# Patient Record
Sex: Female | Born: 1950
Health system: Southern US, Community
[De-identification: ages and names within clinical notes are randomized; demographics above are authoritative.]

## PROBLEM LIST (undated history)

## (undated) DIAGNOSIS — E785 Hyperlipidemia, unspecified: Secondary | ICD-10-CM

## (undated) DIAGNOSIS — J189 Pneumonia, unspecified organism: Secondary | ICD-10-CM

## (undated) DIAGNOSIS — Z923 Personal history of irradiation: Secondary | ICD-10-CM

## (undated) DIAGNOSIS — Z6839 Body mass index (BMI) 39.0-39.9, adult: Secondary | ICD-10-CM

## (undated) DIAGNOSIS — I10 Essential (primary) hypertension: Secondary | ICD-10-CM

## (undated) HISTORY — DX: Hyperlipidemia, unspecified: E78.5

## (undated) HISTORY — DX: Body mass index (BMI) 39.0-39.9, adult: Z68.39

## (undated) HISTORY — PX: EYE SURGERY: SHX253

## (undated) HISTORY — DX: Personal history of irradiation: Z92.3

---

## 2007-05-01 ENCOUNTER — Other Ambulatory Visit: Admission: RE | Admit: 2007-05-01 | Discharge: 2007-05-01 | Payer: Self-pay | Admitting: Gynecology

## 2011-09-17 ENCOUNTER — Emergency Department (HOSPITAL_COMMUNITY)
Admission: EM | Admit: 2011-09-17 | Discharge: 2011-09-17 | Disposition: A | Discharge status: Home / Self Care | Payer: Federal, State, Local not specified - PPO | Attending: Emergency Medicine | Admitting: Emergency Medicine

## 2011-09-17 ENCOUNTER — Encounter (HOSPITAL_COMMUNITY): Payer: Self-pay

## 2011-09-17 DIAGNOSIS — I10 Essential (primary) hypertension: Secondary | ICD-10-CM | POA: Insufficient documentation

## 2011-09-17 DIAGNOSIS — H43399 Other vitreous opacities, unspecified eye: Secondary | ICD-10-CM | POA: Insufficient documentation

## 2011-09-17 DIAGNOSIS — H43391 Other vitreous opacities, right eye: Secondary | ICD-10-CM

## 2011-09-17 DIAGNOSIS — Z79899 Other long term (current) drug therapy: Secondary | ICD-10-CM | POA: Insufficient documentation

## 2011-09-17 HISTORY — DX: Essential (primary) hypertension: I10

## 2011-09-17 NOTE — ED Notes (Addendum)
Patient state she started to see floaters in her vision of her right eye on Friday and denies any pain in eyes bilateral. No redness bilateral or swelling of face around eyes.

## 2011-09-17 NOTE — ED Provider Notes (Signed)
History     CSN: [id number]  Arrival date & time 09/17/11  1420   First MD Initiated Contact with Patient 09/17/11 1505      Chief Complaint  Patient presents with  . Spots and/or Floaters    (Consider location/radiation/quality/duration/timing/severity/associated sxs/prior treatment) HPI Comments: 61yo AAF with PMH significant for hypertension who presents to the ED due to a floater in her right eye onset about 3 days ago. Denies pain or decrease in vision.   Patient is a 61 y.o. female presenting with eye problem. The history is provided by the patient.  Eye Problem  This is a new problem. Episode onset: 3 days. The problem occurs constantly. The problem has not changed since onset.There is pain in the right eye. There was no injury mechanism. The patient is experiencing no pain. There is no history of trauma to the eye. She does not wear contacts. Pertinent negatives include no numbness, no blurred vision, no decreased vision, no discharge, no double vision, no foreign body sensation, no photophobia, no eye redness, no nausea, no vomiting, no tingling, no weakness and no itching. She has tried nothing for the symptoms.    Past Medical History  Diagnosis Date  . Hypertension     History reviewed. No pertinent past surgical history.  No family history on file.  History  Substance Use Topics  . Smoking status: Never Smoker   . Smokeless tobacco: Not on file  . Alcohol Use: Yes    OB History    Grav Para Term Preterm Abortions TAB SAB Ect Mult Living                  Review of Systems  Constitutional: Negative for fever.  HENT: Negative for congestion, rhinorrhea and neck pain.   Eyes: Negative for blurred vision, double vision, photophobia, pain, discharge, redness, itching and visual disturbance.  Respiratory: Negative for shortness of breath.   Cardiovascular: Negative for chest pain.  Gastrointestinal: Negative for nausea, vomiting and abdominal pain.  Skin:  Negative for itching and rash.  Neurological: Negative for dizziness, tingling, syncope, facial asymmetry, speech difficulty, weakness, light-headedness, numbness and headaches.    Allergies  Penicillins  Home Medications   Current Outpatient Rx  Name Route Sig Dispense Refill  . CALCIUM CITRATE + D PO Oral Take 1 tablet by mouth daily.    Marland Kitchen HYDROCHLOROTHIAZIDE 12.5 MG PO TABS Oral Take 12.5 mg by mouth daily.    Marland Kitchen LISINOPRIL 10 MG PO TABS Oral Take 10 mg by mouth daily.    Carma Leaven M PLUS PO TABS Oral Take 1 tablet by mouth daily.    Marland Kitchen PRAVASTATIN SODIUM 40 MG PO TABS Oral Take 40 mg by mouth every morning.      BP 108/73  Temp(Src) 98.2 F (36.8 C) (Oral)  Resp 20  Ht 5' 9.5" (1.765 m)  Wt 250 lb (113.399 kg)  BMI 36.39 kg/m2  SpO2 98%  Physical Exam  Nursing note and vitals reviewed. Constitutional: She is oriented to person, place, and time. She appears well-developed and well-nourished.  HENT:  Head: Normocephalic and atraumatic.  Mouth/Throat: Oropharynx is clear and moist.  Eyes: Conjunctivae, EOM and lids are normal. Pupils are equal, round, and reactive to light. Right eye exhibits no discharge. Left eye exhibits no discharge. No scleral icterus.  Fundoscopic exam:      The right eye shows no AV nicking, no exudate, no hemorrhage and no papilledema.       The left  eye shows no AV nicking, no exudate, no hemorrhage and no papilledema.  Slit lamp exam:      The right eye shows no foreign body, no hyphema and no hypopyon.       The left eye shows no foreign body, no hyphema and no hypopyon.  Neck: Normal range of motion. Neck supple. No tracheal deviation present.       No bruits.   Cardiovascular: Normal rate, regular rhythm, normal heart sounds and intact distal pulses.   No murmur heard. Pulmonary/Chest: Effort normal and breath sounds normal. No stridor. No respiratory distress. She has no wheezes. She has no rales.  Abdominal: Soft. Bowel sounds are normal.  There is no tenderness.  Musculoskeletal: Normal range of motion.  Neurological: She is alert and oriented to person, place, and time. She has normal strength and normal reflexes. No cranial nerve deficit or sensory deficit.    ED Course  Procedures (including critical care time)  Labs Reviewed - No data to display No results found.   1. Flashers or floaters, right eye       MDM  61yo AAF with PMH significant for HTN who presents to the ED due to floater in her right eye. Onset about 3d ago. She is visiting family locally, she lives in [place]. Pt w/o HA, weakness, or numbness. Neuro exam WNL. BP normal. Fundoscopic and slit lamp exam normal. Discussed with opthalmology who does not request further. He will see her in clinic in the morning.         Verne Carrow, MD 09/18/11 [phone]

## 2011-09-17 NOTE — ED Notes (Signed)
EDP at bedside  

## 2011-09-17 NOTE — ED Notes (Signed)
Pt. Developed floaters on Friday,  Denies any pain or discomfort

## 2011-09-18 NOTE — ED Provider Notes (Signed)
I saw and evaluated the patient, reviewed the resident's note and I agree with the findings and plan.  The patient presented complaining of a several day history of having a "floater" in the right eye.  She denies any pain, loss of vision, or trauma.  She is visiting here from [place] and is not due to return there for another month.  On exam, the eye appears grossly normal.  The pupils were equally round and reactive and extraocular muscles were intact.  The corneas were clear.  On fundoscopic exam I was unable to appreciate a retinal detachment or hemorrhage.  There were no visual field cuts.  Opthalmology was consulted and arrangements were made for the patient to be seen by them tomorrow morning.    Geoffery Lyons, MD 09/18/11 [phone]

## 2014-12-21 DIAGNOSIS — Z6835 Body mass index (BMI) 35.0-35.9, adult: Secondary | ICD-10-CM | POA: Diagnosis not present

## 2014-12-21 DIAGNOSIS — E785 Hyperlipidemia, unspecified: Secondary | ICD-10-CM | POA: Diagnosis not present

## 2014-12-21 DIAGNOSIS — I10 Essential (primary) hypertension: Secondary | ICD-10-CM | POA: Diagnosis not present

## 2014-12-21 DIAGNOSIS — E6609 Other obesity due to excess calories: Secondary | ICD-10-CM | POA: Diagnosis not present

## 2015-04-06 DIAGNOSIS — Z1231 Encounter for screening mammogram for malignant neoplasm of breast: Secondary | ICD-10-CM | POA: Diagnosis not present

## 2015-06-23 DIAGNOSIS — E785 Hyperlipidemia, unspecified: Secondary | ICD-10-CM | POA: Diagnosis not present

## 2015-06-23 DIAGNOSIS — Z0000 Encounter for general adult medical examination without abnormal findings: Secondary | ICD-10-CM | POA: Diagnosis not present

## 2015-06-23 DIAGNOSIS — Z6837 Body mass index (BMI) 37.0-37.9, adult: Secondary | ICD-10-CM | POA: Diagnosis not present

## 2015-06-23 DIAGNOSIS — E669 Obesity, unspecified: Secondary | ICD-10-CM | POA: Diagnosis not present

## 2015-06-23 DIAGNOSIS — Z23 Encounter for immunization: Secondary | ICD-10-CM | POA: Diagnosis not present

## 2015-06-23 DIAGNOSIS — I10 Essential (primary) hypertension: Secondary | ICD-10-CM | POA: Diagnosis not present

## 2015-07-26 DIAGNOSIS — Z780 Asymptomatic menopausal state: Secondary | ICD-10-CM | POA: Diagnosis not present

## 2015-07-26 DIAGNOSIS — Z13820 Encounter for screening for osteoporosis: Secondary | ICD-10-CM | POA: Diagnosis not present

## 2015-07-27 DIAGNOSIS — M71572 Other bursitis, not elsewhere classified, left ankle and foot: Secondary | ICD-10-CM | POA: Diagnosis not present

## 2015-07-27 DIAGNOSIS — M216X2 Other acquired deformities of left foot: Secondary | ICD-10-CM | POA: Diagnosis not present

## 2015-07-27 DIAGNOSIS — M7989 Other specified soft tissue disorders: Secondary | ICD-10-CM | POA: Diagnosis not present

## 2015-08-04 DIAGNOSIS — M71572 Other bursitis, not elsewhere classified, left ankle and foot: Secondary | ICD-10-CM | POA: Diagnosis not present

## 2015-08-04 DIAGNOSIS — D2372 Other benign neoplasm of skin of left lower limb, including hip: Secondary | ICD-10-CM | POA: Diagnosis not present

## 2015-08-18 DIAGNOSIS — M71572 Other bursitis, not elsewhere classified, left ankle and foot: Secondary | ICD-10-CM | POA: Diagnosis not present

## 2015-08-18 DIAGNOSIS — S86012A Strain of left Achilles tendon, initial encounter: Secondary | ICD-10-CM | POA: Diagnosis not present

## 2015-12-22 DIAGNOSIS — N858 Other specified noninflammatory disorders of uterus: Secondary | ICD-10-CM | POA: Diagnosis not present

## 2015-12-22 DIAGNOSIS — N950 Postmenopausal bleeding: Secondary | ICD-10-CM | POA: Diagnosis not present

## 2015-12-28 DIAGNOSIS — E669 Obesity, unspecified: Secondary | ICD-10-CM | POA: Diagnosis not present

## 2015-12-28 DIAGNOSIS — I10 Essential (primary) hypertension: Secondary | ICD-10-CM | POA: Diagnosis not present

## 2015-12-28 DIAGNOSIS — E785 Hyperlipidemia, unspecified: Secondary | ICD-10-CM | POA: Diagnosis not present

## 2016-01-31 ENCOUNTER — Emergency Department (HOSPITAL_COMMUNITY)
Admission: EM | Admit: 2016-01-31 | Discharge: 2016-01-31 | Disposition: A | Discharge status: Home / Self Care | Payer: Commercial Managed Care - HMO | Attending: Emergency Medicine | Admitting: Emergency Medicine

## 2016-01-31 ENCOUNTER — Encounter (HOSPITAL_COMMUNITY): Payer: Self-pay | Admitting: Nurse Practitioner

## 2016-01-31 DIAGNOSIS — L509 Urticaria, unspecified: Secondary | ICD-10-CM | POA: Diagnosis not present

## 2016-01-31 DIAGNOSIS — Z79899 Other long term (current) drug therapy: Secondary | ICD-10-CM | POA: Insufficient documentation

## 2016-01-31 DIAGNOSIS — I10 Essential (primary) hypertension: Secondary | ICD-10-CM | POA: Diagnosis not present

## 2016-01-31 DIAGNOSIS — L298 Other pruritus: Secondary | ICD-10-CM | POA: Diagnosis present

## 2016-01-31 MED ORDER — CETIRIZINE HCL 10 MG PO TABS
10.0000 mg | ORAL_TABLET | Freq: Every day | ORAL | Status: DC
Start: 2016-01-31 — End: 2021-08-18

## 2016-01-31 MED ORDER — PREDNISONE 20 MG PO TABS: 40.0000 mg | ORAL_TABLET | Freq: Every day | ORAL | Status: DC

## 2016-01-31 NOTE — ED Provider Notes (Signed)
CSN: [id number]     Arrival date & time 01/31/16  1212 History   By signing my name below, I, Rowan Blase, attest that this documentation has been prepared under the direction and in the presence of non-physician practitioner, Gloriann Loan, PA-C. Electronically Signed: Rowan Blase, Scribe. 01/31/2016. 1:19 PM.    Chief Complaint  Patient presents with  . Skin Problem    The history is provided by the patient. No language interpreter was used.   HPI Comments:  Lindsey Wallace is a 65 y.o. female with PMHx of HTN who presents to the Emergency Department complaining of burning, itchy, swollen areas with 2 on right arm and 1 on left pinky finger she noticed ~0100. Pt thinks she was bitten by something. Pt applied anti-itch lotion with temporary relief. Denies numbness, tingling, fever, chills, shortness of breath, recent changes to soaps or detergents.   Past Medical History  Diagnosis Date  . Hypertension    History reviewed. No pertinent past surgical history. History reviewed. No pertinent family history. Social History  Substance Use Topics  . Smoking status: Never Smoker   . Smokeless tobacco: None  . Alcohol Use: Yes   OB History    No data available     Review of Systems  Constitutional: Negative for fever and chills.  Respiratory: Negative for shortness of breath.   Skin: Positive for wound (bites).  Neurological: Negative for numbness.    Allergies  Penicillins  Home Medications   Prior to Admission medications   Medication Sig Start Date End Date Taking? Authorizing Provider  Calcium Citrate-Vitamin D (CALCIUM CITRATE + D PO) Take 1 tablet by mouth daily.    Historical Provider, MD  hydrochlorothiazide (HYDRODIURIL) 12.5 MG tablet Take 12.5 mg by mouth daily.    Historical Provider, MD  lisinopril (PRINIVIL,ZESTRIL) 10 MG tablet Take 10 mg by mouth daily.    Historical Provider, MD  Multiple Vitamins-Minerals (MULTIVITAMINS THER. W/MINERALS) TABS  Take 1 tablet by mouth daily.    Historical Provider, MD  pravastatin (PRAVACHOL) 40 MG tablet Take 40 mg by mouth every morning.    Historical Provider, MD   BP 110/64 mmHg  Pulse 82  Temp(Src) 98 F (36.7 C) (Oral)  Resp 17  SpO2 100%   Physical Exam  Constitutional: She is oriented to person, place, and time. She appears well-developed and well-nourished.  Non-toxic appearance. She does not have a sickly appearance. She does not appear ill.  HENT:  Head: Normocephalic and atraumatic.  Right Ear: External ear normal.  Left Ear: External ear normal.  Mouth/Throat: Oropharynx is clear and moist.  Eyes: Conjunctivae are normal. No scleral icterus.  Neck: Normal range of motion. Neck supple. No tracheal deviation present.  Cardiovascular: Normal rate, regular rhythm and intact distal pulses.   Pulses:      Radial pulses are 2+ on the right side, and 2+ on the left side.  Pulmonary/Chest: Effort normal and breath sounds normal. No accessory muscle usage or stridor. No respiratory distress. She has no wheezes. She has no rhonchi. She has no rales.  Abdominal: Soft. Bowel sounds are normal. She exhibits no distension. There is no tenderness.  Musculoskeletal: Normal range of motion. She exhibits no tenderness.  Lymphadenopathy:    She has no cervical adenopathy.  Neurological: She is alert and oriented to person, place, and time.  Strength and sensation intact to light touch.   Skin: Skin is warm and dry.  2 hives to RUE.  Compartments soft and compressible.  Psychiatric: She has a normal mood and affect. Her behavior is normal.    ED Course  Procedures  DIAGNOSTIC STUDIES:  Oxygen Saturation is 100% on RA, normal by my interpretation.    COORDINATION OF CARE:  1:17 PM Will start on Zyrtec and short course of Prednisone. Discussed treatment plan with pt at bedside and pt agreed to plan.  Labs Review Labs Reviewed - No data to display  Imaging Review No results found. I  have personally reviewed and evaluated these images and lab results as part of my medical decision-making.   EKG Interpretation None      MDM   Final diagnoses:  Hives   Rx for prednisone and zyrtec. Follow up PCP.  Discussed return precautions.  Patient agrees and acknowledges the above plan for discharge.    Gloriann Loan, PA-C 01/31/16 1323  Sherwood Gambler, MD 02/03/16 [phone]

## 2016-01-31 NOTE — ED Notes (Signed)
Pt c/o waking this morning with painful swollen areas x 2 to R arm and 1 to L pinky finger. Describes pain as "burning." she thinks something may have bitten her in her sleep. Denies new product, medication, food use recently. She is alert and breathing easily

## 2016-01-31 NOTE — Discharge Instructions (Signed)
Hives Hives are itchy, red, swollen areas of the skin. They can vary in size and location on your body. Hives can come and go for hours or several days (acute hives) or for several weeks (chronic hives). Hives do not spread from person to person (noncontagious). They may get worse with scratching, exercise, and emotional stress. CAUSES   Allergic reaction to food, additives, or drugs.  Infections, including the common cold.  Illness, such as vasculitis, lupus, or thyroid disease.  Exposure to sunlight, heat, or cold.  Exercise.  Stress.  Contact with chemicals. SYMPTOMS   Red or white swollen patches on the skin. The patches may change size, shape, and location quickly and repeatedly.  Itching.  Swelling of the hands, feet, and face. This may occur if hives develop deeper in the skin. DIAGNOSIS  Your caregiver can usually tell what is wrong by performing a physical exam. Skin or blood tests may also be done to determine the cause of your hives. In some cases, the cause cannot be determined. TREATMENT  Mild cases usually get better with medicines such as antihistamines. Severe cases may require an emergency epinephrine injection. If the cause of your hives is known, treatment includes avoiding that trigger.  HOME CARE INSTRUCTIONS   Avoid causes that trigger your hives.  Take antihistamines as directed by your caregiver to reduce the severity of your hives. Non-sedating or low-sedating antihistamines are usually recommended. Do not drive while taking an antihistamine.  Take any other medicines prescribed for itching as directed by your caregiver.  Wear loose-fitting clothing.  Keep all follow-up appointments as directed by your caregiver. SEEK MEDICAL CARE IF:   You have persistent or severe itching that is not relieved with medicine.  You have painful or swollen joints. SEEK IMMEDIATE MEDICAL CARE IF:   You have a fever.  Your tongue or lips are swollen.  You have  trouble breathing or swallowing.  You feel tightness in the throat or chest.  You have abdominal pain. These problems may be the first sign of a life-threatening allergic reaction. Call your local emergency services (911 in [place] MAKE SURE YOU:   Understand these instructions.  Will watch your condition.  Will get help right away if you are not doing well or get worse.   This information is not intended to replace advice given to you by your health care provider. Make sure you discuss any questions you have with your health care provider.   Document Released: 08/06/2005 Document Revised: 08/11/2013 Document Reviewed: 10/30/2011 Elsevier Interactive Patient Education 2016 [employer]  

## 2016-04-11 DIAGNOSIS — Z1231 Encounter for screening mammogram for malignant neoplasm of breast: Secondary | ICD-10-CM | POA: Diagnosis not present

## 2016-11-06 DIAGNOSIS — Z0000 Encounter for general adult medical examination without abnormal findings: Secondary | ICD-10-CM | POA: Diagnosis not present

## 2016-11-06 DIAGNOSIS — E785 Hyperlipidemia, unspecified: Secondary | ICD-10-CM | POA: Diagnosis not present

## 2016-11-06 DIAGNOSIS — I10 Essential (primary) hypertension: Secondary | ICD-10-CM | POA: Diagnosis not present

## 2016-11-06 DIAGNOSIS — E669 Obesity, unspecified: Secondary | ICD-10-CM | POA: Diagnosis not present

## 2016-12-18 DIAGNOSIS — Z23 Encounter for immunization: Secondary | ICD-10-CM | POA: Diagnosis not present

## 2017-04-12 DIAGNOSIS — Z1231 Encounter for screening mammogram for malignant neoplasm of breast: Secondary | ICD-10-CM | POA: Diagnosis not present

## 2017-10-11 DIAGNOSIS — H524 Presbyopia: Secondary | ICD-10-CM | POA: Diagnosis not present

## 2017-10-11 DIAGNOSIS — H52202 Unspecified astigmatism, left eye: Secondary | ICD-10-CM | POA: Diagnosis not present

## 2017-10-11 DIAGNOSIS — H2513 Age-related nuclear cataract, bilateral: Secondary | ICD-10-CM | POA: Diagnosis not present

## 2017-10-11 DIAGNOSIS — H5213 Myopia, bilateral: Secondary | ICD-10-CM | POA: Diagnosis not present

## 2017-10-11 DIAGNOSIS — H401131 Primary open-angle glaucoma, bilateral, mild stage: Secondary | ICD-10-CM | POA: Diagnosis not present

## 2017-11-07 DIAGNOSIS — Z0000 Encounter for general adult medical examination without abnormal findings: Secondary | ICD-10-CM | POA: Diagnosis not present

## 2017-11-07 DIAGNOSIS — I10 Essential (primary) hypertension: Secondary | ICD-10-CM | POA: Diagnosis not present

## 2017-11-07 DIAGNOSIS — Z1389 Encounter for screening for other disorder: Secondary | ICD-10-CM | POA: Diagnosis not present

## 2017-11-07 DIAGNOSIS — E785 Hyperlipidemia, unspecified: Secondary | ICD-10-CM | POA: Diagnosis not present

## 2017-11-07 DIAGNOSIS — Z6839 Body mass index (BMI) 39.0-39.9, adult: Secondary | ICD-10-CM | POA: Diagnosis not present

## 2017-11-07 DIAGNOSIS — Z1159 Encounter for screening for other viral diseases: Secondary | ICD-10-CM | POA: Diagnosis not present

## 2017-11-07 DIAGNOSIS — E669 Obesity, unspecified: Secondary | ICD-10-CM | POA: Diagnosis not present

## 2017-11-07 DIAGNOSIS — G4700 Insomnia, unspecified: Secondary | ICD-10-CM | POA: Diagnosis not present

## 2017-12-06 DIAGNOSIS — H401131 Primary open-angle glaucoma, bilateral, mild stage: Secondary | ICD-10-CM | POA: Diagnosis not present

## 2018-03-10 DIAGNOSIS — H401131 Primary open-angle glaucoma, bilateral, mild stage: Secondary | ICD-10-CM | POA: Diagnosis not present

## 2018-04-18 DIAGNOSIS — Z1231 Encounter for screening mammogram for malignant neoplasm of breast: Secondary | ICD-10-CM | POA: Diagnosis not present

## 2018-05-21 DIAGNOSIS — E785 Hyperlipidemia, unspecified: Secondary | ICD-10-CM | POA: Diagnosis not present

## 2018-05-21 DIAGNOSIS — E669 Obesity, unspecified: Secondary | ICD-10-CM | POA: Diagnosis not present

## 2018-05-21 DIAGNOSIS — Z23 Encounter for immunization: Secondary | ICD-10-CM | POA: Diagnosis not present

## 2018-05-21 DIAGNOSIS — I10 Essential (primary) hypertension: Secondary | ICD-10-CM | POA: Diagnosis not present

## 2018-07-04 DIAGNOSIS — H401131 Primary open-angle glaucoma, bilateral, mild stage: Secondary | ICD-10-CM | POA: Diagnosis not present

## 2018-07-23 ENCOUNTER — Other Ambulatory Visit: Payer: Self-pay | Admitting: Physician Assistant

## 2018-12-05 DIAGNOSIS — Z1389 Encounter for screening for other disorder: Secondary | ICD-10-CM | POA: Diagnosis not present

## 2018-12-05 DIAGNOSIS — G4700 Insomnia, unspecified: Secondary | ICD-10-CM | POA: Diagnosis not present

## 2018-12-05 DIAGNOSIS — E785 Hyperlipidemia, unspecified: Secondary | ICD-10-CM | POA: Diagnosis not present

## 2018-12-05 DIAGNOSIS — I10 Essential (primary) hypertension: Secondary | ICD-10-CM | POA: Diagnosis not present

## 2018-12-05 DIAGNOSIS — Z0000 Encounter for general adult medical examination without abnormal findings: Secondary | ICD-10-CM | POA: Diagnosis not present

## 2018-12-05 DIAGNOSIS — E669 Obesity, unspecified: Secondary | ICD-10-CM | POA: Diagnosis not present

## 2019-02-03 DIAGNOSIS — B354 Tinea corporis: Secondary | ICD-10-CM | POA: Diagnosis not present

## 2019-02-03 DIAGNOSIS — L509 Urticaria, unspecified: Secondary | ICD-10-CM | POA: Diagnosis not present

## 2019-04-20 DIAGNOSIS — R29890 Loss of height: Secondary | ICD-10-CM | POA: Diagnosis not present

## 2019-04-20 DIAGNOSIS — Z780 Asymptomatic menopausal state: Secondary | ICD-10-CM | POA: Diagnosis not present

## 2019-04-20 DIAGNOSIS — Z1231 Encounter for screening mammogram for malignant neoplasm of breast: Secondary | ICD-10-CM | POA: Diagnosis not present

## 2019-04-20 DIAGNOSIS — E2839 Other primary ovarian failure: Secondary | ICD-10-CM | POA: Diagnosis not present

## 2019-06-08 DIAGNOSIS — M722 Plantar fascial fibromatosis: Secondary | ICD-10-CM | POA: Diagnosis not present

## 2019-06-08 DIAGNOSIS — I10 Essential (primary) hypertension: Secondary | ICD-10-CM | POA: Diagnosis not present

## 2019-06-08 DIAGNOSIS — E785 Hyperlipidemia, unspecified: Secondary | ICD-10-CM | POA: Diagnosis not present

## 2019-12-09 DIAGNOSIS — R05 Cough: Secondary | ICD-10-CM | POA: Diagnosis not present

## 2019-12-09 DIAGNOSIS — Z1389 Encounter for screening for other disorder: Secondary | ICD-10-CM | POA: Diagnosis not present

## 2019-12-09 DIAGNOSIS — E785 Hyperlipidemia, unspecified: Secondary | ICD-10-CM | POA: Diagnosis not present

## 2019-12-09 DIAGNOSIS — Z0000 Encounter for general adult medical examination without abnormal findings: Secondary | ICD-10-CM | POA: Diagnosis not present

## 2019-12-09 DIAGNOSIS — R0981 Nasal congestion: Secondary | ICD-10-CM | POA: Diagnosis not present

## 2019-12-09 DIAGNOSIS — I10 Essential (primary) hypertension: Secondary | ICD-10-CM | POA: Diagnosis not present

## 2020-02-02 DIAGNOSIS — H401131 Primary open-angle glaucoma, bilateral, mild stage: Secondary | ICD-10-CM | POA: Diagnosis not present

## 2020-04-26 DIAGNOSIS — Z1231 Encounter for screening mammogram for malignant neoplasm of breast: Secondary | ICD-10-CM | POA: Diagnosis not present

## 2020-05-17 DIAGNOSIS — H401131 Primary open-angle glaucoma, bilateral, mild stage: Secondary | ICD-10-CM | POA: Diagnosis not present

## 2020-05-17 DIAGNOSIS — H524 Presbyopia: Secondary | ICD-10-CM | POA: Diagnosis not present

## 2020-05-17 DIAGNOSIS — H2513 Age-related nuclear cataract, bilateral: Secondary | ICD-10-CM | POA: Diagnosis not present

## 2020-05-17 DIAGNOSIS — H5213 Myopia, bilateral: Secondary | ICD-10-CM | POA: Diagnosis not present

## 2020-06-14 DIAGNOSIS — Z7189 Other specified counseling: Secondary | ICD-10-CM | POA: Diagnosis not present

## 2020-06-14 DIAGNOSIS — E669 Obesity, unspecified: Secondary | ICD-10-CM | POA: Diagnosis not present

## 2020-06-14 DIAGNOSIS — E785 Hyperlipidemia, unspecified: Secondary | ICD-10-CM | POA: Diagnosis not present

## 2020-06-14 DIAGNOSIS — I10 Essential (primary) hypertension: Secondary | ICD-10-CM | POA: Diagnosis not present

## 2020-08-08 DIAGNOSIS — H401131 Primary open-angle glaucoma, bilateral, mild stage: Secondary | ICD-10-CM | POA: Diagnosis not present

## 2020-10-06 DIAGNOSIS — Z01812 Encounter for preprocedural laboratory examination: Secondary | ICD-10-CM | POA: Diagnosis not present

## 2020-10-11 DIAGNOSIS — D125 Benign neoplasm of sigmoid colon: Secondary | ICD-10-CM | POA: Diagnosis not present

## 2020-10-11 DIAGNOSIS — K648 Other hemorrhoids: Secondary | ICD-10-CM | POA: Diagnosis not present

## 2020-10-11 DIAGNOSIS — D120 Benign neoplasm of cecum: Secondary | ICD-10-CM | POA: Diagnosis not present

## 2020-10-11 DIAGNOSIS — D123 Benign neoplasm of transverse colon: Secondary | ICD-10-CM | POA: Diagnosis not present

## 2020-10-11 DIAGNOSIS — Z1211 Encounter for screening for malignant neoplasm of colon: Secondary | ICD-10-CM | POA: Diagnosis not present

## 2020-10-11 DIAGNOSIS — D122 Benign neoplasm of ascending colon: Secondary | ICD-10-CM | POA: Diagnosis not present

## 2020-10-11 DIAGNOSIS — K644 Residual hemorrhoidal skin tags: Secondary | ICD-10-CM | POA: Diagnosis not present

## 2020-10-14 DIAGNOSIS — D122 Benign neoplasm of ascending colon: Secondary | ICD-10-CM | POA: Diagnosis not present

## 2020-10-14 DIAGNOSIS — D120 Benign neoplasm of cecum: Secondary | ICD-10-CM | POA: Diagnosis not present

## 2020-10-14 DIAGNOSIS — D123 Benign neoplasm of transverse colon: Secondary | ICD-10-CM | POA: Diagnosis not present

## 2020-10-14 DIAGNOSIS — D125 Benign neoplasm of sigmoid colon: Secondary | ICD-10-CM | POA: Diagnosis not present

## 2020-11-08 DIAGNOSIS — H401131 Primary open-angle glaucoma, bilateral, mild stage: Secondary | ICD-10-CM | POA: Diagnosis not present

## 2020-12-12 DIAGNOSIS — Z1389 Encounter for screening for other disorder: Secondary | ICD-10-CM | POA: Diagnosis not present

## 2020-12-12 DIAGNOSIS — E785 Hyperlipidemia, unspecified: Secondary | ICD-10-CM | POA: Diagnosis not present

## 2020-12-12 DIAGNOSIS — G4700 Insomnia, unspecified: Secondary | ICD-10-CM | POA: Diagnosis not present

## 2020-12-12 DIAGNOSIS — Z0000 Encounter for general adult medical examination without abnormal findings: Secondary | ICD-10-CM | POA: Diagnosis not present

## 2020-12-12 DIAGNOSIS — I10 Essential (primary) hypertension: Secondary | ICD-10-CM | POA: Diagnosis not present

## 2020-12-12 DIAGNOSIS — E669 Obesity, unspecified: Secondary | ICD-10-CM | POA: Diagnosis not present

## 2021-02-16 DIAGNOSIS — H401131 Primary open-angle glaucoma, bilateral, mild stage: Secondary | ICD-10-CM | POA: Diagnosis not present

## 2021-04-13 DIAGNOSIS — N950 Postmenopausal bleeding: Secondary | ICD-10-CM | POA: Diagnosis not present

## 2021-04-13 DIAGNOSIS — E7439 Other disorders of intestinal carbohydrate absorption: Secondary | ICD-10-CM | POA: Diagnosis not present

## 2021-04-13 DIAGNOSIS — R609 Edema, unspecified: Secondary | ICD-10-CM | POA: Diagnosis not present

## 2021-04-13 DIAGNOSIS — M5430 Sciatica, unspecified side: Secondary | ICD-10-CM | POA: Diagnosis not present

## 2021-05-08 DIAGNOSIS — I10 Essential (primary) hypertension: Secondary | ICD-10-CM | POA: Insufficient documentation

## 2021-05-08 DIAGNOSIS — N950 Postmenopausal bleeding: Secondary | ICD-10-CM | POA: Diagnosis not present

## 2021-05-08 DIAGNOSIS — Z01419 Encounter for gynecological examination (general) (routine) without abnormal findings: Secondary | ICD-10-CM | POA: Diagnosis not present

## 2021-05-22 DIAGNOSIS — H401131 Primary open-angle glaucoma, bilateral, mild stage: Secondary | ICD-10-CM | POA: Diagnosis not present

## 2021-05-22 DIAGNOSIS — H5213 Myopia, bilateral: Secondary | ICD-10-CM | POA: Diagnosis not present

## 2021-05-22 DIAGNOSIS — H524 Presbyopia: Secondary | ICD-10-CM | POA: Diagnosis not present

## 2021-05-22 DIAGNOSIS — H2513 Age-related nuclear cataract, bilateral: Secondary | ICD-10-CM | POA: Diagnosis not present

## 2021-05-24 DIAGNOSIS — N8502 Endometrial intraepithelial neoplasia [EIN]: Secondary | ICD-10-CM | POA: Diagnosis not present

## 2021-05-24 DIAGNOSIS — N950 Postmenopausal bleeding: Secondary | ICD-10-CM | POA: Diagnosis not present

## 2021-05-24 DIAGNOSIS — R9389 Abnormal findings on diagnostic imaging of other specified body structures: Secondary | ICD-10-CM | POA: Diagnosis not present

## 2021-05-25 DIAGNOSIS — Z1231 Encounter for screening mammogram for malignant neoplasm of breast: Secondary | ICD-10-CM | POA: Diagnosis not present

## 2021-06-05 DIAGNOSIS — Z780 Asymptomatic menopausal state: Secondary | ICD-10-CM | POA: Diagnosis not present

## 2021-06-16 DIAGNOSIS — Z0100 Encounter for examination of eyes and vision without abnormal findings: Secondary | ICD-10-CM | POA: Diagnosis not present

## 2021-06-16 DIAGNOSIS — N8502 Endometrial intraepithelial neoplasia [EIN]: Secondary | ICD-10-CM | POA: Diagnosis not present

## 2021-07-20 DIAGNOSIS — C801 Malignant (primary) neoplasm, unspecified: Secondary | ICD-10-CM

## 2021-07-20 HISTORY — DX: Malignant (primary) neoplasm, unspecified: C80.1

## 2021-07-26 DIAGNOSIS — R399 Unspecified symptoms and signs involving the genitourinary system: Secondary | ICD-10-CM | POA: Diagnosis not present

## 2021-08-03 DIAGNOSIS — N950 Postmenopausal bleeding: Secondary | ICD-10-CM | POA: Diagnosis not present

## 2021-08-03 DIAGNOSIS — R9389 Abnormal findings on diagnostic imaging of other specified body structures: Secondary | ICD-10-CM | POA: Diagnosis not present

## 2021-08-03 DIAGNOSIS — N840 Polyp of corpus uteri: Secondary | ICD-10-CM | POA: Diagnosis not present

## 2021-08-03 DIAGNOSIS — N8500 Endometrial hyperplasia, unspecified: Secondary | ICD-10-CM | POA: Diagnosis not present

## 2021-08-03 DIAGNOSIS — C541 Malignant neoplasm of endometrium: Secondary | ICD-10-CM | POA: Diagnosis not present

## 2021-08-03 HISTORY — PX: DILATION AND CURETTAGE OF UTERUS: SHX78

## 2021-08-11 ENCOUNTER — Telehealth: Payer: Self-pay | Admitting: *Deleted

## 2021-08-11 NOTE — Telephone Encounter (Signed)
Lat entry----12/22 at 1:15 pm called and spoke with the patient to schedule appt. Patient stated that she would call the office back in 10 minutes. Patient has not returned the call.    12/23 at 1:30 pm called and left the patient a message to call the office back

## 2021-08-15 NOTE — Telephone Encounter (Signed)
Spoke with the patient and moved her appt from 1/6 to 12/30 with Dr Berline Lopes

## 2021-08-17 ENCOUNTER — Encounter: Payer: Self-pay | Admitting: Gynecologic Oncology

## 2021-08-17 DIAGNOSIS — Z09 Encounter for follow-up examination after completed treatment for conditions other than malignant neoplasm: Secondary | ICD-10-CM | POA: Diagnosis not present

## 2021-08-17 DIAGNOSIS — C541 Malignant neoplasm of endometrium: Secondary | ICD-10-CM | POA: Diagnosis not present

## 2021-08-17 DIAGNOSIS — D509 Iron deficiency anemia, unspecified: Secondary | ICD-10-CM | POA: Diagnosis not present

## 2021-08-17 NOTE — Progress Notes (Signed)
GYNECOLOGIC ONCOLOGY NEW PATIENT CONSULTATION   Patient Name: Lindsey Wallace  Patient Age: 70 y.o. Date of Service: 08/18/21 Referring Provider: Eyvonne Mechanic, MD  Primary Care Provider: Default, Provider, MD Consulting Provider: [name], MD   Assessment/Plan:  Postmenopausal patient with clinical stage I high-grade endometrial cancer.  We reviewed the nature of endometrial cancer and its recommended surgical staging, including total hysterectomy, bilateral salpingo-oophorectomy, and lymph node assessment. The patient is a suitable candidate for staging via a minimally invasive approach to surgery.  We reviewed that robotic assistance would be used to complete the surgery.   We discussed that most endometrial cancer is detected early, however, we reviewed that adjuvant therapy will likely be recommended based on the patient's biopsy, however, we will defer to final pathology results.   Given her high risk histology, I recommend CT scan preoperatively to rule out metastatic disease.  We reviewed the sentinel lymph node technique. Risks and benefits of sentinel lymph node biopsy was reviewed. We reviewed the technique and ICG dye. The patient DOES NOT have an iodine allergy or known liver dysfunction. We reviewed the false negative rate (0.4%), and that 3% of patients with metastatic disease will not have it detected by SLN biopsy in endometrial cancer. A low risk of allergic reaction to the dye, <0.2% for ICG, has been reported. We also discussed that in the case of failed mapping, which occurs 40% of the time, a bilateral or unilateral lymphadenectomy will be performed at the surgeons discretion.   Potential benefits of sentinel nodes including a higher detection rate for metastasis due to ultrastaging and potential reduction in operative morbidity. However, there remains uncertainty as to the role for treatment of micrometastatic disease. Further, the benefit of operative  morbidity associated with the SLN technique in endometrial cancer is not yet completely known. In other patient populations (e.g. the cervical cancer population) there has been observed reductions in morbidity with SLN biopsy compared to pelvic lymphadenectomy. Lymphedema, nerve dysfunction and lymphocysts are all potential risks with the SLN technique as with complete lymphadenectomy. Additional risks to the patient include the risk of damage to an internal organ while operating in an altered view (e.g. the black and white image of the robotic fluorescence imaging mode).   We discussed plan for a robotic assisted hysterectomy, bilateral salpingo-oophorectomy, sentinel lymph node evaluation, possible lymph node dissection, possible laparotomy. The risks of surgery were discussed in detail and she understands these to include infection; wound separation; hernia; vaginal cuff separation, injury to adjacent organs such as bowel, bladder, blood vessels, ureters and nerves; bleeding which may require blood transfusion; anesthesia risk; thromboembolic events; possible death; unforeseen complications; possible need for re-exploration; medical complications such as heart attack, stroke, pleural effusion and pneumonia; and, if full lymphadenectomy is performed the risk of lymphedema and lymphocyst. The patient will receive DVT and antibiotic prophylaxis as indicated. She voiced a clear understanding. She had the opportunity to ask questions. Perioperative instructions were reviewed with her. Prescriptions for post-op medications were sent to her pharmacy of choice.  A copy of this note was sent to the patient's referring provider.   65 minutes of total time was spent for this patient encounter, including preparation, face-to-face counseling with the patient and coordination of care, and documentation of the encounter.  [name], MD  Division of Gynecologic Oncology  Department of Obstetrics and Gynecology   [facility]  ___________________________________________  Chief Complaint: Chief Complaint  Patient presents with   Endometrial carcinoma (  [place])    History of Present Illness:  Lindsey Wallace is a 70 y.o. y.o. female who is seen in consultation at the request of Dr. Mardelle Matte for an evaluation of high-grade endometrial cancer.  Patient was initially seen in October for postmenopausal bleeding.  She underwent endometrial biopsy on 05/24/2021 that showed focal simple hyperplasia with atypia, fragments of polypoid inactive endometrium.  No malignancy was identified.  [facility]: Uterus measures 8.9 x 4.8 x 3.9 cm with an endometrial lining of 14 mm.  Normal-appearing adnexa without masses.  No free fluid.  Given hyperplasia, additional sampling was recommended.  On 12/15, the patient underwent hysteroscopy and polypectomy.  Pathology reveals endometrioid endometrial adenocarcinoma, FIGO grade 3.  Endometrial intraepithelial neoplasia seen as well.  She began having postmenopausal bleeding initially in August.  This started with a large spot and then she had no bleeding for a week.  She then would have small amount of spotting randomly and not daily.  Bleeding stopped after her first biopsy in October.  She denies any pain or cramping.  She reports regular bowel and bladder function.  Endorses a good appetite without nausea or emesis.  Family history notable for cancer in both grandmothers, patient thinks uterine cancer.  She has a maternal aunt with a history of leukemia.  She lives in [place] with her husband.  She is retired but works part-time as a Lawyer.  PAST MEDICAL HISTORY:  Past Medical History:  Diagnosis Date   BMI 39.0-39.9,adult    HLD (hyperlipidemia)    Hypertension      PAST SURGICAL HISTORY:  Past Surgical History:  Procedure Laterality Date   DILATION AND CURETTAGE OF UTERUS   08/03/2021   EYE SURGERY      OB/GYN HISTORY:  OB History  Gravida Para Term Preterm AB Living  0 0 0 0 0 0  SAB IAB Ectopic Multiple Live Births  0 0 0 0 0    No LMP recorded. Patient is postmenopausal.  Age at menarche: 43  Age at menopause: 85 Hx of HRT: Denies Hx of STDs: Denies Last pap: 04/2021, negative History of abnormal pap smears: Denies  SCREENING STUDIES:  Last mammogram: 03/2020  Last colonoscopy: 09/2020 Last bone mineral density: 2022  MEDICATIONS: Outpatient Encounter Medications as of 08/18/2021  Medication Sig   hydrochlorothiazide (HYDRODIURIL) 12.5 MG tablet Take 12.5 mg by mouth daily.   latanoprost (XALATAN) 0.005 % ophthalmic solution latanoprost 0.005 % eye drops   pravastatin (PRAVACHOL) 40 MG tablet Take 40 mg by mouth every morning.   Calcium Citrate-Vitamin D (CALCIUM CITRATE + D PO) Take 1 tablet by mouth daily. (Patient not taking: Reported on 08/17/2021)   cetirizine (ZYRTEC) 10 MG tablet Take 1 tablet (10 mg total) by mouth daily. (Patient not taking: Reported on 08/17/2021)   lisinopril (PRINIVIL,ZESTRIL) 10 MG tablet Take 10 mg by mouth daily. (Patient not taking: Reported on 08/17/2021)   Multiple Vitamins-Minerals (MULTIVITAMINS THER. W/MINERALS) TABS Take 1 tablet by mouth daily. (Patient not taking: Reported on 08/17/2021)   predniSONE (DELTASONE) 20 MG tablet Take 2 tablets (40 mg total) by mouth daily. (Patient not taking: Reported on 08/17/2021)   No facility-administered encounter medications on file as of 08/18/2021.    ALLERGIES:  Allergies  Allergen Reactions   Penicillins Rash     FAMILY HISTORY:  Family History  Problem Relation Age of Onset   Uterine cancer Maternal Grandmother    Uterine cancer Paternal  Grandmother    Leukemia Maternal Aunt    Colon cancer Neg Hx    Breast cancer Neg Hx    Ovarian cancer Neg Hx    Endometrial cancer Neg Hx    Pancreatic cancer Neg Hx    Prostate cancer Neg Hx      SOCIAL  HISTORY:  Social Connections: Not on file    REVIEW OF SYSTEMS:  Denies appetite changes, fevers, chills, fatigue, unexplained weight changes. Denies hearing loss, neck lumps or masses, mouth sores, ringing in ears or voice changes. Denies cough or wheezing.  Denies shortness of breath. Denies chest pain or palpitations. Denies leg swelling. Denies abdominal distention, pain, blood in stools, constipation, diarrhea, nausea, vomiting, or early satiety. Denies pain with intercourse, dysuria, frequency, hematuria or incontinence. Denies hot flashes, pelvic pain, vaginal bleeding or vaginal discharge.   Denies joint pain, back pain or muscle pain/cramps. Denies itching, rash, or wounds. Denies dizziness, headaches, numbness or seizures. Denies swollen lymph nodes or glands, denies easy bruising or bleeding. Denies anxiety, depression, confusion, or decreased concentration.  Physical Exam:  Vital Signs for this encounter:  Blood pressure (!) 155/81, pulse 88, temperature (!) 97 F (36.1 C), weight 270 lb (122.5 kg), SpO2 100 %. Body mass index is 39.3 kg/m. General: Alert, oriented, no acute distress.  HEENT: Normocephalic, atraumatic. Sclera anicteric.  Chest: Clear to auscultation bilaterally. No wheezes, rhonchi, or rales. Cardiovascular: Regular rate and rhythm, no murmurs, rubs, or gallops.  Abdomen: Obese. Normoactive bowel sounds. Soft, nondistended, nontender to palpation. No masses or hepatosplenomegaly appreciated. No palpable fluid wave.  Extremities: Grossly normal range of motion. Warm, well perfused. No edema bilaterally.  Skin: No rashes or lesions.  Lymphatics: No cervical, supraclavicular, or inguinal adenopathy.  GU:  Normal external female genitalia. No lesions. No discharge or bleeding.             Bladder/urethra:  No lesions or masses, well supported bladder             Vagina: No lesions or masses.  No bleeding or discharge.             Cervix: Normal appearing,  no lesions.             Uterus: Small, mobile, no parametrial involvement or nodularity.             Adnexa: No masses appreciated.  Rectal: No nodularity.  LABORATORY AND RADIOLOGIC DATA:  Outside medical records were reviewed to synthesize the above history, along with the history and physical obtained during the visit.   No results found for: WBC, HGB, HCT, PLT, GLUCOSE, CHOL, TRIG, HDL, LDLDIRECT, LDLCALC, ALT, AST, NA, K, CL, CREATININE, BUN, CO2, TSH, PSA, INR, GLUF, HGBA1C, MICROALBUR

## 2021-08-17 NOTE — H&P (View-Only) (Signed)
GYNECOLOGIC ONCOLOGY NEW PATIENT CONSULTATION   Patient Name: Lindsey Wallace  Patient Age: 70 y.o. Date of Service: 08/18/21 Referring Provider: Eyvonne Mechanic, MD  Primary Care Provider: Default, Provider, MD Consulting Provider: [name], MD   Assessment/Plan:  Postmenopausal patient with clinical stage I high-grade endometrial cancer.  We reviewed the nature of endometrial cancer and its recommended surgical staging, including total hysterectomy, bilateral salpingo-oophorectomy, and lymph node assessment. The patient is a suitable candidate for staging via a minimally invasive approach to surgery.  We reviewed that robotic assistance would be used to complete the surgery.   We discussed that most endometrial cancer is detected early, however, we reviewed that adjuvant therapy will likely be recommended based on the patient's biopsy, however, we will defer to final pathology results.   Given her high risk histology, I recommend CT scan preoperatively to rule out metastatic disease.  We reviewed the sentinel lymph node technique. Risks and benefits of sentinel lymph node biopsy was reviewed. We reviewed the technique and ICG dye. The patient DOES NOT have an iodine allergy or known liver dysfunction. We reviewed the false negative rate (0.4%), and that 3% of patients with metastatic disease will not have it detected by SLN biopsy in endometrial cancer. A low risk of allergic reaction to the dye, <0.2% for ICG, has been reported. We also discussed that in the case of failed mapping, which occurs 40% of the time, a bilateral or unilateral lymphadenectomy will be performed at the surgeons discretion.   Potential benefits of sentinel nodes including a higher detection rate for metastasis due to ultrastaging and potential reduction in operative morbidity. However, there remains uncertainty as to the role for treatment of micrometastatic disease. Further, the benefit of operative  morbidity associated with the SLN technique in endometrial cancer is not yet completely known. In other patient populations (e.g. the cervical cancer population) there has been observed reductions in morbidity with SLN biopsy compared to pelvic lymphadenectomy. Lymphedema, nerve dysfunction and lymphocysts are all potential risks with the SLN technique as with complete lymphadenectomy. Additional risks to the patient include the risk of damage to an internal organ while operating in an altered view (e.g. the black and white image of the robotic fluorescence imaging mode).   We discussed plan for a robotic assisted hysterectomy, bilateral salpingo-oophorectomy, sentinel lymph node evaluation, possible lymph node dissection, possible laparotomy. The risks of surgery were discussed in detail and she understands these to include infection; wound separation; hernia; vaginal cuff separation, injury to adjacent organs such as bowel, bladder, blood vessels, ureters and nerves; bleeding which may require blood transfusion; anesthesia risk; thromboembolic events; possible death; unforeseen complications; possible need for re-exploration; medical complications such as heart attack, stroke, pleural effusion and pneumonia; and, if full lymphadenectomy is performed the risk of lymphedema and lymphocyst. The patient will receive DVT and antibiotic prophylaxis as indicated. She voiced a clear understanding. She had the opportunity to ask questions. Perioperative instructions were reviewed with her. Prescriptions for post-op medications were sent to her pharmacy of choice.  A copy of this note was sent to the patient's referring provider.   65 minutes of total time was spent for this patient encounter, including preparation, face-to-face counseling with the patient and coordination of care, and documentation of the encounter.  [name], MD  Division of Gynecologic Oncology  Department of Obstetrics and Gynecology   [facility]  ___________________________________________  Chief Complaint: Chief Complaint  Patient presents with   Endometrial carcinoma (  [place])    History of Present Illness:  Lindsey Wallace is a 70 y.o. y.o. female who is seen in consultation at the request of Dr. Mardelle Matte for an evaluation of high-grade endometrial cancer.  Patient was initially seen in October for postmenopausal bleeding.  She underwent endometrial biopsy on 05/24/2021 that showed focal simple hyperplasia with atypia, fragments of polypoid inactive endometrium.  No malignancy was identified.  [facility]: Uterus measures 8.9 x 4.8 x 3.9 cm with an endometrial lining of 14 mm.  Normal-appearing adnexa without masses.  No free fluid.  Given hyperplasia, additional sampling was recommended.  On 12/15, the patient underwent hysteroscopy and polypectomy.  Pathology reveals endometrioid endometrial adenocarcinoma, FIGO grade 3.  Endometrial intraepithelial neoplasia seen as well.  She began having postmenopausal bleeding initially in August.  This started with a large spot and then she had no bleeding for a week.  She then would have small amount of spotting randomly and not daily.  Bleeding stopped after her first biopsy in October.  She denies any pain or cramping.  She reports regular bowel and bladder function.  Endorses a good appetite without nausea or emesis.  Family history notable for cancer in both grandmothers, patient thinks uterine cancer.  She has a maternal aunt with a history of leukemia.  She lives in [place] with her husband.  She is retired but works part-time as a Lawyer.  PAST MEDICAL HISTORY:  Past Medical History:  Diagnosis Date   BMI 39.0-39.9,adult    HLD (hyperlipidemia)    Hypertension      PAST SURGICAL HISTORY:  Past Surgical History:  Procedure Laterality Date   DILATION AND CURETTAGE OF UTERUS   08/03/2021   EYE SURGERY      OB/GYN HISTORY:  OB History  Gravida Para Term Preterm AB Living  0 0 0 0 0 0  SAB IAB Ectopic Multiple Live Births  0 0 0 0 0    No LMP recorded. Patient is postmenopausal.  Age at menarche: 54  Age at menopause: 30 Hx of HRT: Denies Hx of STDs: Denies Last pap: 04/2021, negative History of abnormal pap smears: Denies  SCREENING STUDIES:  Last mammogram: 03/2020  Last colonoscopy: 09/2020 Last bone mineral density: 2022  MEDICATIONS: Outpatient Encounter Medications as of 08/18/2021  Medication Sig   hydrochlorothiazide (HYDRODIURIL) 12.5 MG tablet Take 12.5 mg by mouth daily.   latanoprost (XALATAN) 0.005 % ophthalmic solution latanoprost 0.005 % eye drops   pravastatin (PRAVACHOL) 40 MG tablet Take 40 mg by mouth every morning.   Calcium Citrate-Vitamin D (CALCIUM CITRATE + D PO) Take 1 tablet by mouth daily. (Patient not taking: Reported on 08/17/2021)   cetirizine (ZYRTEC) 10 MG tablet Take 1 tablet (10 mg total) by mouth daily. (Patient not taking: Reported on 08/17/2021)   lisinopril (PRINIVIL,ZESTRIL) 10 MG tablet Take 10 mg by mouth daily. (Patient not taking: Reported on 08/17/2021)   Multiple Vitamins-Minerals (MULTIVITAMINS THER. W/MINERALS) TABS Take 1 tablet by mouth daily. (Patient not taking: Reported on 08/17/2021)   predniSONE (DELTASONE) 20 MG tablet Take 2 tablets (40 mg total) by mouth daily. (Patient not taking: Reported on 08/17/2021)   No facility-administered encounter medications on file as of 08/18/2021.    ALLERGIES:  Allergies  Allergen Reactions   Penicillins Rash     FAMILY HISTORY:  Family History  Problem Relation Age of Onset   Uterine cancer Maternal Grandmother    Uterine cancer Paternal  Grandmother    Leukemia Maternal Aunt    Colon cancer Neg Hx    Breast cancer Neg Hx    Ovarian cancer Neg Hx    Endometrial cancer Neg Hx    Pancreatic cancer Neg Hx    Prostate cancer Neg Hx      SOCIAL  HISTORY:  Social Connections: Not on file    REVIEW OF SYSTEMS:  Denies appetite changes, fevers, chills, fatigue, unexplained weight changes. Denies hearing loss, neck lumps or masses, mouth sores, ringing in ears or voice changes. Denies cough or wheezing.  Denies shortness of breath. Denies chest pain or palpitations. Denies leg swelling. Denies abdominal distention, pain, blood in stools, constipation, diarrhea, nausea, vomiting, or early satiety. Denies pain with intercourse, dysuria, frequency, hematuria or incontinence. Denies hot flashes, pelvic pain, vaginal bleeding or vaginal discharge.   Denies joint pain, back pain or muscle pain/cramps. Denies itching, rash, or wounds. Denies dizziness, headaches, numbness or seizures. Denies swollen lymph nodes or glands, denies easy bruising or bleeding. Denies anxiety, depression, confusion, or decreased concentration.  Physical Exam:  Vital Signs for this encounter:  Blood pressure (!) 155/81, pulse 88, temperature (!) 97 F (36.1 C), weight 270 lb (122.5 kg), SpO2 100 %. Body mass index is 39.3 kg/m. General: Alert, oriented, no acute distress.  HEENT: Normocephalic, atraumatic. Sclera anicteric.  Chest: Clear to auscultation bilaterally. No wheezes, rhonchi, or rales. Cardiovascular: Regular rate and rhythm, no murmurs, rubs, or gallops.  Abdomen: Obese. Normoactive bowel sounds. Soft, nondistended, nontender to palpation. No masses or hepatosplenomegaly appreciated. No palpable fluid wave.  Extremities: Grossly normal range of motion. Warm, well perfused. No edema bilaterally.  Skin: No rashes or lesions.  Lymphatics: No cervical, supraclavicular, or inguinal adenopathy.  GU:  Normal external female genitalia. No lesions. No discharge or bleeding.             Bladder/urethra:  No lesions or masses, well supported bladder             Vagina: No lesions or masses.  No bleeding or discharge.             Cervix: Normal appearing,  no lesions.             Uterus: Small, mobile, no parametrial involvement or nodularity.             Adnexa: No masses appreciated.  Rectal: No nodularity.  LABORATORY AND RADIOLOGIC DATA:  Outside medical records were reviewed to synthesize the above history, along with the history and physical obtained during the visit.   No results found for: WBC, HGB, HCT, PLT, GLUCOSE, CHOL, TRIG, HDL, LDLDIRECT, LDLCALC, ALT, AST, NA, K, CL, CREATININE, BUN, CO2, TSH, PSA, INR, GLUF, HGBA1C, MICROALBUR

## 2021-08-18 ENCOUNTER — Inpatient Hospital Stay: Payer: Medicare HMO

## 2021-08-18 ENCOUNTER — Other Ambulatory Visit: Payer: Self-pay

## 2021-08-18 ENCOUNTER — Encounter: Payer: Self-pay | Admitting: Gynecologic Oncology

## 2021-08-18 ENCOUNTER — Inpatient Hospital Stay: Payer: Medicare HMO | Attending: Gynecologic Oncology | Admitting: Gynecologic Oncology

## 2021-08-18 ENCOUNTER — Inpatient Hospital Stay (HOSPITAL_BASED_OUTPATIENT_CLINIC_OR_DEPARTMENT_OTHER): Payer: Medicare HMO | Admitting: Gynecologic Oncology

## 2021-08-18 ENCOUNTER — Telehealth: Payer: Self-pay

## 2021-08-18 VITALS — BP 155/81 | HR 88 | Temp 97.0°F | Wt 270.0 lb

## 2021-08-18 DIAGNOSIS — Z807 Family history of other malignant neoplasms of lymphoid, hematopoietic and related tissues: Secondary | ICD-10-CM | POA: Diagnosis not present

## 2021-08-18 DIAGNOSIS — I10 Essential (primary) hypertension: Secondary | ICD-10-CM | POA: Diagnosis not present

## 2021-08-18 DIAGNOSIS — Z809 Family history of malignant neoplasm, unspecified: Secondary | ICD-10-CM | POA: Insufficient documentation

## 2021-08-18 DIAGNOSIS — C541 Malignant neoplasm of endometrium: Secondary | ICD-10-CM | POA: Diagnosis not present

## 2021-08-18 DIAGNOSIS — E669 Obesity, unspecified: Secondary | ICD-10-CM

## 2021-08-18 DIAGNOSIS — M5430 Sciatica, unspecified side: Secondary | ICD-10-CM | POA: Insufficient documentation

## 2021-08-18 DIAGNOSIS — Z79899 Other long term (current) drug therapy: Secondary | ICD-10-CM | POA: Insufficient documentation

## 2021-08-18 DIAGNOSIS — G4700 Insomnia, unspecified: Secondary | ICD-10-CM | POA: Insufficient documentation

## 2021-08-18 DIAGNOSIS — Z6839 Body mass index (BMI) 39.0-39.9, adult: Secondary | ICD-10-CM | POA: Insufficient documentation

## 2021-08-18 DIAGNOSIS — E785 Hyperlipidemia, unspecified: Secondary | ICD-10-CM | POA: Diagnosis not present

## 2021-08-18 LAB — BASIC METABOLIC PANEL - CANCER CENTER ONLY
Anion gap: 6 (ref 5–15)
BUN: 19 mg/dL (ref 8–23)
CO2: 29 mmol/L (ref 22–32)
Calcium: 9.4 mg/dL (ref 8.9–10.3)
Chloride: 106 mmol/L (ref 98–111)
Creatinine: 0.87 mg/dL (ref 0.44–1.00)
GFR, Estimated: >60 mL/min (ref >60)
Glucose, Bld: 97 mg/dL (ref 70–99)
Potassium: 4.2 mmol/L (ref 3.5–5.1)
Sodium: 141 mmol/L (ref 135–145)

## 2021-08-18 MED ORDER — TRAMADOL HCL 50 MG PO TABS: 50.0000 mg | ORAL_TABLET | Freq: Four times a day (QID) | ORAL | 0 refills | Status: DC | PRN

## 2021-08-18 MED ORDER — SENNOSIDES-DOCUSATE SODIUM 8.6-50 MG PO TABS: 2.0000 | ORAL_TABLET | Freq: Every day | ORAL | 0 refills | Status: DC

## 2021-08-18 NOTE — Progress Notes (Signed)
Patient here for new patient consultation with Dr. [name] and for a pre-operative discussion prior to her scheduled surgery on August 29, 2021. She is scheduled for robotic assisted total laparoscopic hysterectomy, bilateral salpingo-oophorectomy, sentinel lymph node biopsy, possible lymph node dissection, possible laparotomy.  The surgery was discussed in detail.  See after visit summary for additional details. Visual aids used to discuss items related to surgery including sequential compression stockings, foley catheter, IV pump, multi-modal pain regimen including tylenol, photo of the surgical robot, female reproductive system to discuss surgery in detail.      Discussed post-op pain management in detail including the aspects of the enhanced recovery pathway.  Advised her that a new prescription would be sent in for tramadol and it is only to be used for after her upcoming surgery.  We discussed the use of tylenol post-op and to monitor for a maximum of 4,000 mg in a 24 hour period.  Also prescribed sennakot to be used after surgery and to hold if having loose stools.  Discussed bowel regimen in detail.     Discussed the use of heparin pre-op, SCDs, and measures to take at home to prevent DVT including frequent mobility.  Reportable signs and symptoms of DVT discussed. Post-operative instructions discussed and expectations for after surgery. Incisional care discussed as well including reportable signs and symptoms including erythema, drainage, wound separation.     10 minutes spent with the patient.  Verbalizing understanding of material discussed. No needs or concerns voiced at the end of the visit.   Advised patient to call for any needs.  Advised that her post-operative medications had been prescribed and could be picked up at any time.  CT scan information discussed and contrast given. She is advised she will be contacted with results of Bmet from today.    This appointment is included in  the global surgical bundle as pre-operative teaching and has no charge.

## 2021-08-18 NOTE — Patient Instructions (Addendum)
Plan to have a CT scan before surgery and Dr. Berline Lopes will contact you with the results.   Preparing for your Surgery  Plan for surgery on August 29, 2021 with Dr. [name] at [facility] will be scheduled for robotic assisted total laparoscopic hysterectomy (removal of the uterus and cervix), bilateral salpingo-oophorectomy (removal of both ovaries and fallopian tubes), sentinel lymph node biopsy, possible lymph node dissection, possible laparotomy (larger incision on your abdomen if needed).   Pre-operative Testing -You will receive a phone call from presurgical testing at [facility] to arrange for a pre-operative appointment and lab work.  -Bring your insurance card, copy of an advanced directive if applicable, medication list  -At that visit, you will be asked to sign a consent for a possible blood transfusion in case a transfusion becomes necessary during surgery.  The need for a blood transfusion is rare but having consent is a necessary part of your care.     -You should not be taking blood thinners or aspirin at least ten days prior to surgery unless instructed by your surgeon.  -Do not take supplements such as fish oil (omega 3), red yeast rice, turmeric before your surgery. You want to avoid medications with aspirin in them including headache powders such as BC or Goody's), Excedrin migraine.  Day Before Surgery at [facility] will be asked to take in a light diet the day before surgery. You will be advised you can have clear liquids up until 3 hours before your surgery.    Eat a light diet the day before surgery.  Examples including soups, broths, toast, yogurt, mashed potatoes.  AVOID GAS PRODUCING FOODS. Things to avoid include carbonated beverages (fizzy beverages, sodas), raw fruits and raw vegetables (uncooked), or beans.   If your bowels are filled with gas, your surgeon will have difficulty visualizing your pelvic organs which increases your  surgical risks.  Your role in recovery Your role is to become active as soon as directed by your doctor, while still giving yourself time to heal.  Rest when you feel tired. You will be asked to do the following in order to speed your recovery:  - Cough and breathe deeply. This helps to clear and expand your lungs and can prevent pneumonia after surgery.  - [place]. Do mild physical activity. Walking or moving your legs help your circulation and body functions return to normal. Do not try to get up or walk alone the first time after surgery.   -If you develop swelling on one leg or the other, pain in the back of your leg, redness/warmth in one of your legs, please call the office or go to the Emergency Room to have a doppler to rule out a blood clot. For shortness of breath, chest pain-seek care in the Emergency Room as soon as possible. - Actively manage your pain. Managing your pain lets you move in comfort. We will ask you to rate your pain on a scale of zero to 10. It is your responsibility to tell your doctor or nurse where and how much you hurt so your pain can be treated.  Special Considerations -If you are diabetic, you may be placed on insulin after surgery to have closer control over your blood sugars to promote healing and recovery.  This does not mean that you will be discharged on insulin.  If applicable, your oral antidiabetics will be resumed when you are tolerating a solid diet.  -  Your final pathology results from surgery should be available around one week after surgery and the results will be relayed to you when available.  -FMLA forms can be faxed to [phone] and please allow 5-7 business days for completion.  Pain Management After Surgery -You have been prescribed your pain medication and bowel regimen medications before surgery so that you can have these available when you are discharged from the hospital. The pain medication is for use ONLY AFTER  surgery and a new prescription will not be given.   -Make sure that you have Tylenol and Ibuprofen at home to use on a regular basis after surgery for pain control. We recommend alternating the medications every hour to six hours since they work differently and are processed in the body differently for pain relief.  -Review the attached handout on narcotic use and their risks and side effects.   Bowel Regimen -You have been prescribed Sennakot-S to take nightly to prevent constipation especially if you are taking the narcotic pain medication intermittently.  It is important to prevent constipation and drink adequate amounts of liquids. You can stop taking this medication when you are not taking pain medication and you are back on your normal bowel routine.  Risks of Surgery Risks of surgery are low but include bleeding, infection, damage to surrounding structures, re-operation, blood clots, and very rarely death.   Blood Transfusion Information (For the consent to be signed before surgery)  We will be checking your blood type before surgery so in case of emergencies, we will know what type of blood you would need.                                            WHAT IS A BLOOD TRANSFUSION?  A transfusion is the replacement of blood or some of its parts. Blood is made up of multiple cells which provide different functions. Red blood cells carry oxygen and are used for blood loss replacement. White blood cells fight against infection. Platelets control bleeding. Plasma helps clot blood. Other blood products are available for specialized needs, such as hemophilia or other clotting disorders. BEFORE THE TRANSFUSION  Who gives blood for transfusions?  You may be able to donate blood to be used at a later date on yourself (autologous donation). Relatives can be asked to donate blood. This is generally not any safer than if you have received blood from a stranger. The same precautions are taken to  ensure safety when a relative's blood is donated. Healthy volunteers who are fully evaluated to make sure their blood is safe. This is blood bank blood. Transfusion therapy is the safest it has ever been in the practice of medicine. Before blood is taken from a donor, a complete history is taken to make sure that person has no history of diseases nor engages in risky social behavior (examples are intravenous drug use or sexual activity with multiple partners). The donor's travel history is screened to minimize risk of transmitting infections, such as malaria. The donated blood is tested for signs of infectious diseases, such as HIV and hepatitis. The blood is then tested to be sure it is compatible with you in order to minimize the chance of a transfusion reaction. If you or a relative donates blood, this is often done in anticipation of surgery and is not appropriate for emergency situations. It takes many days  to process the donated blood. RISKS AND COMPLICATIONS Although transfusion therapy is very safe and saves many lives, the main dangers of transfusion include:  Getting an infectious disease. Developing a transfusion reaction. This is an allergic reaction to something in the blood you were given. Every precaution is taken to prevent this. The decision to have a blood transfusion has been considered carefully by your caregiver before blood is given. Blood is not given unless the benefits outweigh the risks.  AFTER SURGERY INSTRUCTIONS  Return to work: 4-6 weeks if applicable  Activity: 1. Be up and out of the bed during the day.  Take a nap if needed.  You may walk up steps but be careful and use the hand rail.  Stair climbing will tire you more than you think, you may need to stop part way and rest.   2. No lifting or straining for 6 weeks over 10 pounds. No pushing, pulling, straining for 6 weeks.  3. No driving for 1 week(s).  Do not drive if you are taking narcotic pain medicine and make  sure that your reaction time has returned.   4. You can shower as soon as the next day after surgery. Shower daily.  Use your regular soap and water (not directly on the incision) and pat your incision(s) dry afterwards; don't rub.  No tub baths or submerging your body in water until cleared by your surgeon. If you have the soap that was given to you by pre-surgical testing that was used before surgery, you do not need to use it afterwards because this can irritate your incisions.   5. No sexual activity and nothing in the vagina for 8 weeks.  6. You may experience a small amount of clear drainage from your incisions, which is normal.  If the drainage persists, increases, or changes color please call the office.  7. Do not use creams, lotions, or ointments such as neosporin on your incisions after surgery until advised by your surgeon because they can cause removal of the dermabond glue on your incisions.    8. You may experience vaginal spotting after surgery or around the 6-8 week mark from surgery when the stitches at the top of the vagina begin to dissolve.  The spotting is normal but if you experience heavy bleeding, call our office.  9. Take Tylenol or ibuprofen (you have at home if your kidney function is normal) first for pain and only use narcotic pain medication for severe pain not relieved by the Tylenol or Ibuprofen.  Monitor your Tylenol intake to a max of 4,000 mg in a 24 hour period. You can alternate these medications after surgery.  Diet: 1. Low sodium Heart Healthy Diet is recommended but you are cleared to resume your normal (before surgery) diet after your procedure.  2. It is safe to use a laxative, such as Miralax or Colace, if you have difficulty moving your bowels. You have been prescribed Sennakot-S to take at bedtime every evening after surgery to keep bowel movements regular and to prevent constipation.    Wound Care: 1. Keep clean and dry.  Shower daily.  Reasons to  call the Doctor: Fever - Oral temperature greater than 100.4 degrees Fahrenheit Foul-smelling vaginal discharge Difficulty urinating Nausea and vomiting Increased pain at the site of the incision that is unrelieved with pain medicine. Difficulty breathing with or without chest pain New calf pain especially if only on one side Sudden, continuing increased vaginal bleeding with or without clots.  Contacts: For questions or concerns you should contact:  Dr. [name] at [phone]  Joylene John, NP at [phone]  After Hours: call [phone] and have the GYN Oncologist paged/contacted (after 5 pm or on the weekends).  Messages sent via mychart are for non-urgent matters and are not responded to after hours so for urgent needs, please call the after hours number.

## 2021-08-18 NOTE — Telephone Encounter (Signed)
Attempted to reach Lindsey Wallace this afternoon to review her metabolic panel results from today (08/18/21). No answer. Left VM.

## 2021-08-18 NOTE — Patient Instructions (Signed)
Plan to have a CT scan before surgery and Dr. Berline Lopes will contact you with the results.    Preparing for your Surgery   Plan for surgery on August 29, 2021 with Dr. [name] at [facility] will be scheduled for robotic assisted total laparoscopic hysterectomy (removal of the uterus and cervix), bilateral salpingo-oophorectomy (removal of both ovaries and fallopian tubes), sentinel lymph node biopsy, possible lymph node dissection, possible laparotomy (larger incision on your abdomen if needed).    Pre-operative Testing -You will receive a phone call from presurgical testing at [facility] to arrange for a pre-operative appointment and lab work.   -Bring your insurance card, copy of an advanced directive if applicable, medication list   -At that visit, you will be asked to sign a consent for a possible blood transfusion in case a transfusion becomes necessary during surgery.  The need for a blood transfusion is rare but having consent is a necessary part of your care.      -You should not be taking blood thinners or aspirin at least ten days prior to surgery unless instructed by your surgeon.   -Do not take supplements such as fish oil (omega 3), red yeast rice, turmeric before your surgery. You want to avoid medications with aspirin in them including headache powders such as BC or Goody's), Excedrin migraine.   Day Before Surgery at [facility] will be asked to take in a light diet the day before surgery. You will be advised you can have clear liquids up until 3 hours before your surgery.     Eat a light diet the day before surgery.  Examples including soups, broths, toast, yogurt, mashed potatoes.  AVOID GAS PRODUCING FOODS. Things to avoid include carbonated beverages (fizzy beverages, sodas), raw fruits and raw vegetables (uncooked), or beans.    If your bowels are filled with gas, your surgeon will have difficulty visualizing your pelvic organs which increases  your surgical risks.   Your role in recovery Your role is to become active as soon as directed by your doctor, while still giving yourself time to heal.  Rest when you feel tired. You will be asked to do the following in order to speed your recovery:   - Cough and breathe deeply. This helps to clear and expand your lungs and can prevent pneumonia after surgery.  - [place]. Do mild physical activity. Walking or moving your legs help your circulation and body functions return to normal. Do not try to get up or walk alone the first time after surgery.   -If you develop swelling on one leg or the other, pain in the back of your leg, redness/warmth in one of your legs, please call the office or go to the Emergency Room to have a doppler to rule out a blood clot. For shortness of breath, chest pain-seek care in the Emergency Room as soon as possible. - Actively manage your pain. Managing your pain lets you move in comfort. We will ask you to rate your pain on a scale of zero to 10. It is your responsibility to tell your doctor or nurse where and how much you hurt so your pain can be treated.   Special Considerations -If you are diabetic, you may be placed on insulin after surgery to have closer control over your blood sugars to promote healing and recovery.  This does not mean that you will be discharged on insulin.  If applicable,  your oral antidiabetics will be resumed when you are tolerating a solid diet.   -Your final pathology results from surgery should be available around one week after surgery and the results will be relayed to you when available.   -FMLA forms can be faxed to [phone] and please allow 5-7 business days for completion.   Pain Management After Surgery -You have been prescribed your pain medication and bowel regimen medications before surgery so that you can have these available when you are discharged from the hospital. The pain medication is for use ONLY  AFTER surgery and a new prescription will not be given.    -Make sure that you have Tylenol and Ibuprofen at home to use on a regular basis after surgery for pain control. We recommend alternating the medications every hour to six hours since they work differently and are processed in the body differently for pain relief.   -Review the attached handout on narcotic use and their risks and side effects.    Bowel Regimen -You have been prescribed Sennakot-S to take nightly to prevent constipation especially if you are taking the narcotic pain medication intermittently.  It is important to prevent constipation and drink adequate amounts of liquids. You can stop taking this medication when you are not taking pain medication and you are back on your normal bowel routine.   Risks of Surgery Risks of surgery are low but include bleeding, infection, damage to surrounding structures, re-operation, blood clots, and very rarely death.     Blood Transfusion Information (For the consent to be signed before surgery)   We will be checking your blood type before surgery so in case of emergencies, we will know what type of blood you would need.                                             WHAT IS A BLOOD TRANSFUSION?   A transfusion is the replacement of blood or some of its parts. Blood is made up of multiple cells which provide different functions. Red blood cells carry oxygen and are used for blood loss replacement. White blood cells fight against infection. Platelets control bleeding. Plasma helps clot blood. Other blood products are available for specialized needs, such as hemophilia or other clotting disorders. BEFORE THE TRANSFUSION  Who gives blood for transfusions?  You may be able to donate blood to be used at a later date on yourself (autologous donation). Relatives can be asked to donate blood. This is generally not any safer than if you have received blood from a stranger. The same precautions  are taken to ensure safety when a relative's blood is donated. Healthy volunteers who are fully evaluated to make sure their blood is safe. This is blood bank blood. Transfusion therapy is the safest it has ever been in the practice of medicine. Before blood is taken from a donor, a complete history is taken to make sure that person has no history of diseases nor engages in risky social behavior (examples are intravenous drug use or sexual activity with multiple partners). The donor's travel history is screened to minimize risk of transmitting infections, such as malaria. The donated blood is tested for signs of infectious diseases, such as HIV and hepatitis. The blood is then tested to be sure it is compatible with you in order to minimize the chance of a transfusion reaction.  If you or a relative donates blood, this is often done in anticipation of surgery and is not appropriate for emergency situations. It takes many days to process the donated blood. RISKS AND COMPLICATIONS Although transfusion therapy is very safe and saves many lives, the main dangers of transfusion include:  Getting an infectious disease. Developing a transfusion reaction. This is an allergic reaction to something in the blood you were given. Every precaution is taken to prevent this. The decision to have a blood transfusion has been considered carefully by your caregiver before blood is given. Blood is not given unless the benefits outweigh the risks.   AFTER SURGERY INSTRUCTIONS   Return to work: 4-6 weeks if applicable   Activity: 1. Be up and out of the bed during the day.  Take a nap if needed.  You may walk up steps but be careful and use the hand rail.  Stair climbing will tire you more than you think, you may need to stop part way and rest.    2. No lifting or straining for 6 weeks over 10 pounds. No pushing, pulling, straining for 6 weeks.   3. No driving for 1 week(s).  Do not drive if you are taking narcotic pain  medicine and make sure that your reaction time has returned.    4. You can shower as soon as the next day after surgery. Shower daily.  Use your regular soap and water (not directly on the incision) and pat your incision(s) dry afterwards; don't rub.  No tub baths or submerging your body in water until cleared by your surgeon. If you have the soap that was given to you by pre-surgical testing that was used before surgery, you do not need to use it afterwards because this can irritate your incisions.    5. No sexual activity and nothing in the vagina for 8 weeks.   6. You may experience a small amount of clear drainage from your incisions, which is normal.  If the drainage persists, increases, or changes color please call the office.   7. Do not use creams, lotions, or ointments such as neosporin on your incisions after surgery until advised by your surgeon because they can cause removal of the dermabond glue on your incisions.     8. You may experience vaginal spotting after surgery or around the 6-8 week mark from surgery when the stitches at the top of the vagina begin to dissolve.  The spotting is normal but if you experience heavy bleeding, call our office.   9. Take Tylenol or ibuprofen (you have at home if your kidney function is normal) first for pain and only use narcotic pain medication for severe pain not relieved by the Tylenol or Ibuprofen.  Monitor your Tylenol intake to a max of 4,000 mg in a 24 hour period. You can alternate these medications after surgery.   Diet: 1. Low sodium Heart Healthy Diet is recommended but you are cleared to resume your normal (before surgery) diet after your procedure.   2. It is safe to use a laxative, such as Miralax or Colace, if you have difficulty moving your bowels. You have been prescribed Sennakot-S to take at bedtime every evening after surgery to keep bowel movements regular and to prevent constipation.     Wound Care: 1. Keep clean and dry.   Shower daily.   Reasons to call the Doctor: Fever - Oral temperature greater than 100.4 degrees Fahrenheit Foul-smelling vaginal discharge Difficulty urinating Nausea  and vomiting Increased pain at the site of the incision that is unrelieved with pain medicine. Difficulty breathing with or without chest pain New calf pain especially if only on one side Sudden, continuing increased vaginal bleeding with or without clots.   Contacts: For questions or concerns you should contact:   Dr. [name] at [phone]   Joylene John, NP at [phone]   After Hours: call [phone] and have the GYN Oncologist paged/contacted (after 5 pm or on the weekends).   Messages sent via mychart are for non-urgent matters and are not responded to after hours so for urgent needs, please call the after hours number.

## 2021-08-21 ENCOUNTER — Ambulatory Visit (HOSPITAL_COMMUNITY)
Admission: RE | Admit: 2021-08-21 | Discharge: 2021-08-21 | Disposition: A | Discharge status: Home / Self Care | Payer: Medicare HMO | Source: Ambulatory Visit | Attending: Gynecologic Oncology | Admitting: Gynecologic Oncology

## 2021-08-21 ENCOUNTER — Encounter (HOSPITAL_COMMUNITY): Payer: Self-pay

## 2021-08-21 DIAGNOSIS — N200 Calculus of kidney: Secondary | ICD-10-CM | POA: Diagnosis not present

## 2021-08-21 DIAGNOSIS — C541 Malignant neoplasm of endometrium: Secondary | ICD-10-CM | POA: Diagnosis not present

## 2021-08-21 DIAGNOSIS — I700 Atherosclerosis of aorta: Secondary | ICD-10-CM | POA: Diagnosis not present

## 2021-08-21 DIAGNOSIS — R918 Other nonspecific abnormal finding of lung field: Secondary | ICD-10-CM | POA: Diagnosis not present

## 2021-08-21 DIAGNOSIS — K8020 Calculus of gallbladder without cholecystitis without obstruction: Secondary | ICD-10-CM | POA: Diagnosis not present

## 2021-08-21 DIAGNOSIS — I2510 Atherosclerotic heart disease of native coronary artery without angina pectoris: Secondary | ICD-10-CM | POA: Diagnosis not present

## 2021-08-21 MED ORDER — SODIUM CHLORIDE (PF) 0.9 % IJ SOLN
INTRAMUSCULAR | Status: AC
  Filled 2021-08-21: qty 50

## 2021-08-21 MED ORDER — IOHEXOL 350 MG/ML SOLN
80.0000 mL | Freq: Once | INTRAVENOUS | Status: AC | PRN
  Administered 2021-08-21: 80 mL via INTRAVENOUS

## 2021-08-22 ENCOUNTER — Telehealth: Payer: Self-pay | Admitting: Gynecologic Oncology

## 2021-08-22 ENCOUNTER — Telehealth: Payer: Self-pay

## 2021-08-22 DIAGNOSIS — H401131 Primary open-angle glaucoma, bilateral, mild stage: Secondary | ICD-10-CM | POA: Diagnosis not present

## 2021-08-22 NOTE — Telephone Encounter (Signed)
Called the patient and discussed CT results.  All questions answered.  [name] MD Gynecologic Oncology

## 2021-08-22 NOTE — Telephone Encounter (Signed)
Spoke with Lindsey Wallace this afternoon regarding her lab results from 08/18/2021. Pt informed that her basic metabolic panel is normal. Patient verbalized understanding.

## 2021-08-23 NOTE — Patient Instructions (Addendum)
DUE TO COVID-19 ONLY ONE VISITOR IS ALLOWED TO COME WITH YOU AND STAY IN THE WAITING ROOM ONLY DURING PRE OP AND PROCEDURE.   **NO VISITORS ARE ALLOWED IN THE SHORT STAY AREA OR RECOVERY ROOM!!**       Your procedure is scheduled on: Tuesday, Jan. 10, 2023   Report to [facility] Main Entrance    Report to admitting at 12:30 PM   Call this number if you have problems the morning of surgery [phone]   Do not eat food :After Midnight.   May have liquids until 11:45 AM   day of surgery  CLEAR LIQUID DIET  Foods Allowed                                                                     Foods Excluded  Water, Black Coffee and tea, regular and decaf                             liquids that you cannot  Plain Jell-O in any flavor  (No red)                                           see through such as: Fruit ices (not with fruit pulp)                                     milk, soups, orange juice              Iced Popsicles (No red)                                    All solid food                                   Apple juices Sports drinks like Gatorade (No red) Lightly seasoned clear broth or consume(fat free) Sugar  Sample Menu Breakfast                                Lunch                                     Supper Cranberry juice                    Beef broth                            Chicken broth Jell-O                                     Grape juice  Apple juice Coffee or tea                        Jell-O                                      Popsicle                                                Coffee or tea                        Coffee or tea        The day of surgery:  Drink ONE (1) Pre-Surgery Clear Ensure at 11:45 AM the morning of surgery. Drink in one sitting. Do not sip.  This drink was given to you during your hospital  pre-op appointment visit. Nothing else to drink after completing the  Pre-Surgery Clear Ensure.           If you have questions, please contact your surgeons office.     Oral Hygiene is also important to reduce your risk of infection.                                    Remember - BRUSH YOUR TEETH THE MORNING OF SURGERY WITH YOUR REGULAR TOOTHPASTE   Do NOT smoke after Midnight   Take these medicines the morning of surgery with A SIP OF WATER: Pravastatin             You may not have any metal on your body including hair pins, jewelry, and body piercing             Do not wear make-up, lotions, powders, perfumes/cologne, or deodorant  Do not wear nail polish including gel and S&S, artificial/acrylic nails, or any other type of covering on natural nails including finger and toenails. If you have artificial nails, gel coating, etc. that needs to be removed by a nail salon please have this removed prior to surgery or surgery may need to be canceled/ delayed if the surgeon/ anesthesia feels like they are unable to be safely monitored.   Do not shave  48 hours prior to surgery.    Do not bring valuables to the hospital. [place].   Contacts, dentures or bridgework may not be worn into surgery.    Patients discharged on the day of surgery will not be allowed to drive home.  We recommend you have a responsible adult to stay with you for the first 24 hours after anesthesia.   Special Instructions: Bring a copy of your healthcare power of attorney and living will documents         the day of surgery if you haven't scanned them before.              Please read over the following fact sheets you were given: IF YOU HAVE QUESTIONS ABOUT YOUR PRE-OP INSTRUCTIONS PLEASE CALL [phone]     [facility] - Preparing for Surgery Before surgery, you can play an important role.  Because skin is not sterile, your skin needs to be as free of germs as possible.  You can reduce the number of germs on your skin by washing with CHG (chlorahexidine gluconate) soap  before surgery.  CHG is an antiseptic cleaner which kills germs and bonds with the skin to continue killing germs even after washing. Please DO NOT use if you have an allergy to CHG or antibacterial soaps.  If your skin becomes reddened/irritated stop using the CHG and inform your nurse when you arrive at Short Stay. Do not shave (including legs and underarms) for at least 48 hours prior to the first CHG shower.  You may shave your face/neck.  Please follow these instructions carefully:  1.  Shower with CHG Soap the night before surgery and the  morning of surgery.  2.  If you choose to wash your hair, wash your hair first as usual with your normal  shampoo.  3.  After you shampoo, rinse your hair and body thoroughly to remove the shampoo.                             4.  Use CHG as you would any other liquid soap.  You can apply chg directly to the skin and wash.  Gently with a scrungie or clean washcloth.  5.  Apply the CHG Soap to your body ONLY FROM THE NECK DOWN.   Do   not use on face/ open                           Wound or open sores. Avoid contact with eyes, ears mouth and   genitals (private parts).                       Wash face,  Genitals (private parts) with your normal soap.             6.  Wash thoroughly, paying special attention to the area where your    surgery  will be performed.  7.  Thoroughly rinse your body with warm water from the neck down.  8.  DO NOT shower/wash with your normal soap after using and rinsing off the CHG Soap.                9.  Pat yourself dry with a clean towel.            10.  Wear clean pajamas.            11.  Place clean sheets on your bed the night of your first shower and do not  sleep with pets. Day of Surgery : Do not apply any lotions/deodorants the morning of surgery.  Please wear clean clothes to the hospital/surgery center.  FAILURE TO FOLLOW THESE INSTRUCTIONS MAY RESULT IN THE CANCELLATION OF YOUR SURGERY  PATIENT  SIGNATURE_________________________________  NURSE SIGNATURE__________________________________  ________________________________________________________________________  WHAT IS A BLOOD TRANSFUSION? Blood Transfusion Information  A transfusion is the replacement of blood or some of its parts. Blood is made up of multiple cells which provide different functions. Red blood cells carry oxygen and are used for blood loss replacement. White blood cells fight against infection. Platelets control bleeding. Plasma helps clot blood. Other blood products are available for specialized needs, such as hemophilia or other clotting disorders. BEFORE THE TRANSFUSION  Who gives blood for transfusions?  Healthy volunteers who  are fully evaluated to make sure their blood is safe. This is blood bank blood. Transfusion therapy is the safest it has ever been in the practice of medicine. Before blood is taken from a donor, a complete history is taken to make sure that person has no history of diseases nor engages in risky social behavior (examples are intravenous drug use or sexual activity with multiple partners). The donor's travel history is screened to minimize risk of transmitting infections, such as malaria. The donated blood is tested for signs of infectious diseases, such as HIV and hepatitis. The blood is then tested to be sure it is compatible with you in order to minimize the chance of a transfusion reaction. If you or a relative donates blood, this is often done in anticipation of surgery and is not appropriate for emergency situations. It takes many days to process the donated blood. RISKS AND COMPLICATIONS Although transfusion therapy is very safe and saves many lives, the main dangers of transfusion include:  Getting an infectious disease. Developing a transfusion reaction. This is an allergic reaction to something in the blood you were given. Every precaution is taken to prevent this. The decision to  have a blood transfusion has been considered carefully by your caregiver before blood is given. Blood is not given unless the benefits outweigh the risks. AFTER THE TRANSFUSION Right after receiving a blood transfusion, you will usually feel much better and more energetic. This is especially true if your red blood cells have gotten low (anemic). The transfusion raises the level of the red blood cells which carry oxygen, and this usually causes an energy increase. The nurse administering the transfusion will monitor you carefully for complications. HOME CARE INSTRUCTIONS  No special instructions are needed after a transfusion. You may find your energy is better. Speak with your caregiver about any limitations on activity for underlying diseases you may have. SEEK MEDICAL CARE IF:  Your condition is not improving after your transfusion. You develop redness or irritation at the intravenous (IV) site. SEEK IMMEDIATE MEDICAL CARE IF:  Any of the following symptoms occur over the next 12 hours: Shaking chills. You have a temperature by mouth above 102 F (38.9 C), not controlled by medicine. Chest, back, or muscle pain. People around you feel you are not acting correctly or are confused. Shortness of breath or difficulty breathing. Dizziness and fainting. You get a rash or develop hives. You have a decrease in urine output. Your urine turns a dark color or changes to pink, red, or brown. Any of the following symptoms occur over the next 10 days: You have a temperature by mouth above 102 F (38.9 C), not controlled by medicine. Shortness of breath. Weakness after normal activity. The white part of the eye turns yellow (jaundice). You have a decrease in the amount of urine or are urinating less often. Your urine turns a dark color or changes to pink, red, or brown. Document Released: 08/03/2000 Document Revised: 10/29/2011 Document Reviewed: 03/22/2008 [facility] Patient Information 2014  [place], [place].  _______________________________________________________________________

## 2021-08-23 NOTE — Progress Notes (Addendum)
For Short Stay: [place] appointment date:N/A Date of COVID positive in last 41 days: N/A   For Anesthesia: PCP - Dr Kelton Pillar Cardiologist - N/A  CT Chest 08/21/21 in epic EKG - 08/28/2021 in epic Stress Test - N/A ECHO - N/A Cardiac Cath - N/A Pacemaker/ICD device last checked: N/A Pacemaker orders received: N/A Device Rep notified: N/A  Sleep Study - N/A CPAP - N/A  Fasting Blood Sugar - N/A Checks Blood Sugar _N/A____ times a day  Blood Thinner Instructions: N/A Aspirin Instructions: N/A Last Dose: N/A  Activity level: Can go up a flight of stairs and activities of daily living without stopping and without chest pain and/or shortness of breath    Anesthesia review:  N/A  Patient denies shortness of breath, fever, cough and chest pain at PAT appointment   Patient verbalized understanding of instructions that were given to them at the PAT appointment. Patient was also instructed that they will need to review over the PAT instructions again at home before surgery.

## 2021-08-24 ENCOUNTER — Other Ambulatory Visit: Payer: Self-pay

## 2021-08-24 ENCOUNTER — Encounter (HOSPITAL_COMMUNITY): Payer: Self-pay | Admitting: Gynecologic Oncology

## 2021-08-25 ENCOUNTER — Ambulatory Visit: Payer: Federal, State, Local not specified - PPO | Admitting: Gynecologic Oncology

## 2021-08-28 ENCOUNTER — Ambulatory Visit (HOSPITAL_COMMUNITY): Payer: Federal, State, Local not specified - PPO

## 2021-08-28 ENCOUNTER — Encounter (HOSPITAL_COMMUNITY)
Admission: RE | Admit: 2021-08-28 | Discharge: 2021-08-28 | Disposition: A | Discharge status: Home / Self Care | Payer: Medicare PPO | Source: Ambulatory Visit | Attending: Gynecologic Oncology | Admitting: Gynecologic Oncology

## 2021-08-28 ENCOUNTER — Telehealth: Payer: Self-pay

## 2021-08-28 ENCOUNTER — Other Ambulatory Visit: Payer: Self-pay

## 2021-08-28 DIAGNOSIS — Z807 Family history of other malignant neoplasms of lymphoid, hematopoietic and related tissues: Secondary | ICD-10-CM | POA: Diagnosis not present

## 2021-08-28 DIAGNOSIS — I10 Essential (primary) hypertension: Secondary | ICD-10-CM | POA: Diagnosis not present

## 2021-08-28 DIAGNOSIS — C541 Malignant neoplasm of endometrium: Secondary | ICD-10-CM

## 2021-08-28 DIAGNOSIS — Z01818 Encounter for other preprocedural examination: Secondary | ICD-10-CM | POA: Insufficient documentation

## 2021-08-28 DIAGNOSIS — N840 Polyp of corpus uteri: Secondary | ICD-10-CM | POA: Diagnosis not present

## 2021-08-28 DIAGNOSIS — D259 Leiomyoma of uterus, unspecified: Secondary | ICD-10-CM | POA: Diagnosis not present

## 2021-08-28 DIAGNOSIS — Z6841 Body Mass Index (BMI) 40.0 and over, adult: Secondary | ICD-10-CM | POA: Diagnosis not present

## 2021-08-28 DIAGNOSIS — N8003 Adenomyosis of the uterus: Secondary | ICD-10-CM | POA: Diagnosis not present

## 2021-08-28 LAB — HEPATIC FUNCTION PANEL
ALT: 14 U/L (ref 0–44)
AST: 19 U/L (ref 15–41)
Albumin: 3.6 g/dL (ref 3.5–5.0)
Alkaline Phosphatase: 72 U/L (ref 38–126)
Bilirubin, Direct: <0.1 mg/dL (ref 0.0–0.2)
Total Bilirubin: 0.4 mg/dL (ref 0.3–1.2)
Total Protein: 7 g/dL (ref 6.5–8.1)

## 2021-08-28 LAB — CBC
HCT: 42.8 % (ref 36.0–46.0)
Hemoglobin: 12.9 g/dL (ref 12.0–15.0)
MCH: 21.8 pg — ABNORMAL LOW (ref 26.0–34.0)
MCHC: 30.1 g/dL (ref 30.0–36.0)
MCV: 72.3 fL — ABNORMAL LOW (ref 80.0–100.0)
Platelets: 214 10*3/uL (ref 150–400)
RBC: 5.92 MIL/uL — ABNORMAL HIGH (ref 3.87–5.11)
RDW: 16.6 % — ABNORMAL HIGH (ref 11.5–15.5)
WBC: 6.1 10*3/uL (ref 4.0–10.5)
nRBC: 0 % (ref 0.0–0.2)

## 2021-08-28 NOTE — Telephone Encounter (Signed)
Telephone call to check on pre-operative status.  Patient compliant with pre-operative instructions.  Reinforced nothing to eat after midnight. Clear liquids until 1145. Patient to arrive at 1230.  No questions or concerns voiced.  Instructed to call for any needs.

## 2021-08-29 ENCOUNTER — Ambulatory Visit (HOSPITAL_COMMUNITY)
Admission: RE | Admit: 2021-08-29 | Discharge: 2021-08-29 | Disposition: A | Discharge status: Home / Self Care | Payer: Medicare PPO | Source: Ambulatory Visit | Attending: Gynecologic Oncology | Admitting: Gynecologic Oncology

## 2021-08-29 ENCOUNTER — Ambulatory Visit (HOSPITAL_COMMUNITY): Payer: Medicare PPO | Admitting: Certified Registered"

## 2021-08-29 ENCOUNTER — Encounter (HOSPITAL_COMMUNITY)
Admission: RE | Disposition: A | Discharge status: Home / Self Care | Payer: Self-pay | Source: Ambulatory Visit | Attending: Gynecologic Oncology

## 2021-08-29 ENCOUNTER — Encounter (HOSPITAL_COMMUNITY): Payer: Self-pay | Admitting: Gynecologic Oncology

## 2021-08-29 DIAGNOSIS — N8003 Adenomyosis of the uterus: Secondary | ICD-10-CM | POA: Diagnosis not present

## 2021-08-29 DIAGNOSIS — D259 Leiomyoma of uterus, unspecified: Secondary | ICD-10-CM | POA: Diagnosis not present

## 2021-08-29 DIAGNOSIS — Z6839 Body mass index (BMI) 39.0-39.9, adult: Secondary | ICD-10-CM

## 2021-08-29 DIAGNOSIS — Z807 Family history of other malignant neoplasms of lymphoid, hematopoietic and related tissues: Secondary | ICD-10-CM | POA: Insufficient documentation

## 2021-08-29 DIAGNOSIS — I10 Essential (primary) hypertension: Secondary | ICD-10-CM | POA: Insufficient documentation

## 2021-08-29 DIAGNOSIS — Z6841 Body Mass Index (BMI) 40.0 and over, adult: Secondary | ICD-10-CM | POA: Diagnosis not present

## 2021-08-29 DIAGNOSIS — C541 Malignant neoplasm of endometrium: Secondary | ICD-10-CM | POA: Insufficient documentation

## 2021-08-29 DIAGNOSIS — N840 Polyp of corpus uteri: Secondary | ICD-10-CM | POA: Insufficient documentation

## 2021-08-29 DIAGNOSIS — N736 Female pelvic peritoneal adhesions (postinfective): Secondary | ICD-10-CM | POA: Diagnosis not present

## 2021-08-29 HISTORY — DX: Pneumonia, unspecified organism: J18.9

## 2021-08-29 HISTORY — PX: ROBOTIC ASSISTED BILATERAL SALPINGO OOPHERECTOMY: SHX6078

## 2021-08-29 HISTORY — PX: SENTINEL NODE BIOPSY: SHX6608

## 2021-08-29 HISTORY — PX: LYMPH NODE DISSECTION: SHX5087

## 2021-08-29 LAB — TYPE AND SCREEN
ABO/RH(D): A POS
Antibody Screen: NEGATIVE

## 2021-08-29 LAB — ABO/RH: ABO/RH(D): A POS

## 2021-08-29 SURGERY — SALPINGO-OOPHORECTOMY, BILATERAL, ROBOT-ASSISTED
Anesthesia: General | Site: Abdomen

## 2021-08-29 MED ORDER — ROCURONIUM BROMIDE 10 MG/ML (PF) SYRINGE
PREFILLED_SYRINGE | INTRAVENOUS | Status: AC
  Filled 2021-08-29: qty 10

## 2021-08-29 MED ORDER — LIDOCAINE 2% (20 MG/ML) 5 ML SYRINGE
INTRAMUSCULAR | Status: DC | PRN
  Administered 2021-08-29: 60 mg via INTRAVENOUS

## 2021-08-29 MED ORDER — FENTANYL CITRATE PF 50 MCG/ML IJ SOSY: 25.0000 ug | PREFILLED_SYRINGE | INTRAMUSCULAR | Status: DC | PRN

## 2021-08-29 MED ORDER — PHENYLEPHRINE 40 MCG/ML (10ML) SYRINGE FOR IV PUSH (FOR BLOOD PRESSURE SUPPORT)
PREFILLED_SYRINGE | INTRAVENOUS | Status: DC | PRN
  Administered 2021-08-29 (×4): 80 ug via INTRAVENOUS

## 2021-08-29 MED ORDER — CHLORHEXIDINE GLUCONATE 0.12 % MT SOLN
15.0000 mL | Freq: Once | OROMUCOSAL | Status: AC
  Administered 2021-08-29: 15 mL via OROMUCOSAL

## 2021-08-29 MED ORDER — ENSURE PRE-SURGERY PO LIQD
296.0000 mL | Freq: Once | ORAL | Status: DC
  Filled 2021-08-29: qty 296

## 2021-08-29 MED ORDER — EPHEDRINE SULFATE-NACL 50-0.9 MG/10ML-% IV SOSY
PREFILLED_SYRINGE | INTRAVENOUS | Status: DC | PRN
  Administered 2021-08-29: 5 mg via INTRAVENOUS

## 2021-08-29 MED ORDER — CIPROFLOXACIN IN D5W 400 MG/200ML IV SOLN
400.0000 mg | INTRAVENOUS | Status: AC
  Administered 2021-08-29: 400 mg via INTRAVENOUS
  Filled 2021-08-29: qty 200

## 2021-08-29 MED ORDER — KETAMINE HCL 50 MG/5ML IJ SOSY
PREFILLED_SYRINGE | INTRAMUSCULAR | Status: AC
  Filled 2021-08-29: qty 5

## 2021-08-29 MED ORDER — DEXAMETHASONE SODIUM PHOSPHATE 10 MG/ML IJ SOLN
INTRAMUSCULAR | Status: AC
  Filled 2021-08-29: qty 1

## 2021-08-29 MED ORDER — ORAL CARE MOUTH RINSE: 15.0000 mL | Freq: Once | OROMUCOSAL | Status: AC

## 2021-08-29 MED ORDER — ALBUMIN HUMAN 5 % IV SOLN: INTRAVENOUS | Status: DC | PRN

## 2021-08-29 MED ORDER — STERILE WATER FOR IRRIGATION IR SOLN
Status: DC | PRN
  Administered 2021-08-29: 1000 mL

## 2021-08-29 MED ORDER — ACETAMINOPHEN 500 MG PO TABS
1000.0000 mg | ORAL_TABLET | ORAL | Status: AC
  Administered 2021-08-29: 1000 mg via ORAL
  Filled 2021-08-29: qty 2

## 2021-08-29 MED ORDER — DEXAMETHASONE SODIUM PHOSPHATE 4 MG/ML IJ SOLN: 4.0000 mg | INTRAMUSCULAR | Status: DC

## 2021-08-29 MED ORDER — SURGIFLO WITH THROMBIN (HEMOSTATIC MATRIX KIT) OPTIME
TOPICAL | Status: DC | PRN
  Administered 2021-08-29: 1 via TOPICAL

## 2021-08-29 MED ORDER — LIDOCAINE HCL (PF) 2 % IJ SOLN
INTRAMUSCULAR | Status: AC
  Filled 2021-08-29: qty 5

## 2021-08-29 MED ORDER — DEXAMETHASONE SODIUM PHOSPHATE 4 MG/ML IJ SOLN
4.0000 mg | INTRAMUSCULAR | Status: AC
  Administered 2021-08-29: 8 mg via INTRAVENOUS

## 2021-08-29 MED ORDER — EPHEDRINE 5 MG/ML INJ
INTRAVENOUS | Status: AC
  Filled 2021-08-29: qty 5

## 2021-08-29 MED ORDER — BUPIVACAINE HCL 0.25 % IJ SOLN
INTRAMUSCULAR | Status: DC | PRN
  Administered 2021-08-29: 36 mL

## 2021-08-29 MED ORDER — PROPOFOL 10 MG/ML IV BOLUS
INTRAVENOUS | Status: DC | PRN
  Administered 2021-08-29: 30 mg via INTRAVENOUS
  Administered 2021-08-29: 40 mg via INTRAVENOUS
  Administered 2021-08-29: 130 mg via INTRAVENOUS

## 2021-08-29 MED ORDER — LACTATED RINGERS IR SOLN
Status: DC | PRN
  Administered 2021-08-29: 1000 mL

## 2021-08-29 MED ORDER — LIDOCAINE 20MG/ML (2%) 15 ML SYRINGE OPTIME
INTRAMUSCULAR | Status: DC | PRN
  Administered 2021-08-29: 1.5 mg/kg/h via INTRAVENOUS

## 2021-08-29 MED ORDER — FENTANYL CITRATE (PF) 100 MCG/2ML IJ SOLN
INTRAMUSCULAR | Status: AC
  Filled 2021-08-29: qty 2

## 2021-08-29 MED ORDER — CLINDAMYCIN PHOSPHATE 900 MG/50ML IV SOLN
900.0000 mg | INTRAVENOUS | Status: AC
  Administered 2021-08-29: 900 mg via INTRAVENOUS
  Filled 2021-08-29: qty 50

## 2021-08-29 MED ORDER — ONDANSETRON HCL 4 MG/2ML IJ SOLN
INTRAMUSCULAR | Status: DC | PRN
Start: 2021-08-29 — End: 2021-08-29
  Administered 2021-08-29: 4 mg via INTRAVENOUS

## 2021-08-29 MED ORDER — STERILE WATER FOR IRRIGATION IR SOLN
Status: DC | PRN
  Administered 2021-08-29: 3000 mL

## 2021-08-29 MED ORDER — LACTATED RINGERS IV SOLN: INTRAVENOUS | Status: DC

## 2021-08-29 MED ORDER — SUGAMMADEX SODIUM 200 MG/2ML IV SOLN
INTRAVENOUS | Status: DC | PRN
  Administered 2021-08-29: 400 mg via INTRAVENOUS

## 2021-08-29 MED ORDER — FENTANYL CITRATE (PF) 250 MCG/5ML IJ SOLN
INTRAMUSCULAR | Status: DC | PRN
  Administered 2021-08-29: 100 ug via INTRAVENOUS
  Administered 2021-08-29 (×2): 50 ug via INTRAVENOUS

## 2021-08-29 MED ORDER — BUPIVACAINE HCL 0.25 % IJ SOLN
INTRAMUSCULAR | Status: AC
  Filled 2021-08-29: qty 1

## 2021-08-29 MED ORDER — ALBUMIN HUMAN 5 % IV SOLN
INTRAVENOUS | Status: AC
  Filled 2021-08-29: qty 250

## 2021-08-29 MED ORDER — PROPOFOL 10 MG/ML IV BOLUS
INTRAVENOUS | Status: AC
  Filled 2021-08-29: qty 20

## 2021-08-29 MED ORDER — KETAMINE HCL 10 MG/ML IJ SOLN
INTRAMUSCULAR | Status: DC | PRN
  Administered 2021-08-29: 30 mg via INTRAVENOUS

## 2021-08-29 MED ORDER — MIDAZOLAM HCL 5 MG/5ML IJ SOLN
INTRAMUSCULAR | Status: DC | PRN
Start: 2021-08-29 — End: 2021-08-29
  Administered 2021-08-29: 2 mg via INTRAVENOUS

## 2021-08-29 MED ORDER — ONDANSETRON HCL 4 MG/2ML IJ SOLN
INTRAMUSCULAR | Status: AC
  Filled 2021-08-29: qty 2

## 2021-08-29 MED ORDER — ROCURONIUM BROMIDE 10 MG/ML (PF) SYRINGE
PREFILLED_SYRINGE | INTRAVENOUS | Status: DC | PRN
  Administered 2021-08-29: 10 mg via INTRAVENOUS
  Administered 2021-08-29: 100 mg via INTRAVENOUS

## 2021-08-29 MED ORDER — STERILE WATER FOR INJECTION IJ SOLN
INTRAMUSCULAR | Status: AC
  Filled 2021-08-29: qty 10

## 2021-08-29 MED ORDER — HEPARIN SODIUM (PORCINE) 5000 UNIT/ML IJ SOLN
5000.0000 [IU] | INTRAMUSCULAR | Status: AC
  Administered 2021-08-29: 5000 [IU] via SUBCUTANEOUS
  Filled 2021-08-29: qty 1

## 2021-08-29 SURGICAL SUPPLY — 69 items
APPLICATOR SURGIFLO ENDO (HEMOSTASIS) ×1 IMPLANT
BAG COUNTER SPONGE SURGICOUNT (BAG) IMPLANT
BAG LAPAROSCOPIC 12 15 PORT 16 (BASKET) IMPLANT
BAG RETRIEVAL 12/15 (BASKET)
BLADE SURG SZ10 CARB STEEL (BLADE) IMPLANT
CATH FOLEY 2WAY SLVR  5CC 14FR (CATHETERS) ×1
CATH FOLEY 2WAY SLVR 5CC 14FR (CATHETERS) IMPLANT
COVER BACK TABLE 60X90IN (DRAPES) ×3 IMPLANT
COVER TIP SHEARS 8 DVNC (MISCELLANEOUS) ×2 IMPLANT
COVER TIP SHEARS 8MM DA VINCI (MISCELLANEOUS) ×1
DECANTER SPIKE VIAL GLASS SM (MISCELLANEOUS) ×3 IMPLANT
DERMABOND ADVANCED (GAUZE/BANDAGES/DRESSINGS) ×1
DERMABOND ADVANCED .7 DNX12 (GAUZE/BANDAGES/DRESSINGS) ×2 IMPLANT
DRAPE ARM DVNC X/XI (DISPOSABLE) ×8 IMPLANT
DRAPE COLUMN DVNC XI (DISPOSABLE) ×2 IMPLANT
DRAPE DA VINCI XI ARM (DISPOSABLE) ×4
DRAPE DA VINCI XI COLUMN (DISPOSABLE) ×1
DRAPE SHEET LG 3/4 BI-LAMINATE (DRAPES) ×3 IMPLANT
DRAPE SURG IRRIG POUCH 19X23 (DRAPES) ×3 IMPLANT
DRSG OPSITE POSTOP 4X6 (GAUZE/BANDAGES/DRESSINGS) IMPLANT
DRSG OPSITE POSTOP 4X8 (GAUZE/BANDAGES/DRESSINGS) IMPLANT
ELECT PENCIL ROCKER SW 15FT (MISCELLANEOUS) IMPLANT
ELECT REM PT RETURN 15FT ADLT (MISCELLANEOUS) ×3 IMPLANT
GAUZE 4X4 16PLY ~~LOC~~+RFID DBL (SPONGE) ×2 IMPLANT
GLOVE SURG ENC MOIS LTX SZ6 (GLOVE) ×12 IMPLANT
GLOVE SURG ENC MOIS LTX SZ6.5 (GLOVE) ×6 IMPLANT
GOWN STRL REUS W/ TWL LRG LVL3 (GOWN DISPOSABLE) ×8 IMPLANT
GOWN STRL REUS W/TWL LRG LVL3 (GOWN DISPOSABLE) ×4
HOLDER FOLEY CATH W/STRAP (MISCELLANEOUS) ×1 IMPLANT
IRRIG SUCT STRYKERFLOW 2 WTIP (MISCELLANEOUS) ×3
IRRIGATION SUCT STRKRFLW 2 WTP (MISCELLANEOUS) ×2 IMPLANT
KIT PROCEDURE DA VINCI SI (MISCELLANEOUS)
KIT PROCEDURE DVNC SI (MISCELLANEOUS) IMPLANT
KIT TURNOVER KIT A (KITS) IMPLANT
MANIPULATOR ADVINCU DEL 3.0 PL (MISCELLANEOUS) IMPLANT
MANIPULATOR ADVINCU DEL 3.5 PL (MISCELLANEOUS) IMPLANT
MANIPULATOR UTERINE 4.5 ZUMI (MISCELLANEOUS) ×1 IMPLANT
NDL HYPO 21X1.5 SAFETY (NEEDLE) ×2 IMPLANT
NDL SPNL 18GX3.5 QUINCKE PK (NEEDLE) IMPLANT
NEEDLE HYPO 21X1.5 SAFETY (NEEDLE) ×3 IMPLANT
NEEDLE SPNL 18GX3.5 QUINCKE PK (NEEDLE) IMPLANT
OBTURATOR OPTICAL STANDARD 8MM (TROCAR) ×1
OBTURATOR OPTICAL STND 8 DVNC (TROCAR) ×2
OBTURATOR OPTICALSTD 8 DVNC (TROCAR) ×2 IMPLANT
PACK ROBOT GYN CUSTOM WL (TRAY / TRAY PROCEDURE) ×3 IMPLANT
PAD POSITIONING PINK XL (MISCELLANEOUS) ×3 IMPLANT
PORT ACCESS TROCAR AIRSEAL 12 (TROCAR) ×2 IMPLANT
PORT ACCESS TROCAR AIRSEAL 5M (TROCAR) ×1
POUCH SPECIMEN RETRIEVAL 10MM (ENDOMECHANICALS) ×2 IMPLANT
SCRUB EXIDINE 4% CHG 4OZ (MISCELLANEOUS) ×3 IMPLANT
SEAL CANN UNIV 5-8 DVNC XI (MISCELLANEOUS) ×8 IMPLANT
SEAL XI 5MM-8MM UNIVERSAL (MISCELLANEOUS) ×4
SET TRI-LUMEN FLTR TB AIRSEAL (TUBING) ×3 IMPLANT
SURGIFLO W/THROMBIN 8M KIT (HEMOSTASIS) ×1 IMPLANT
SUT MNCRL AB 4-0 PS2 18 (SUTURE) IMPLANT
SUT PDS AB 1 TP1 96 (SUTURE) IMPLANT
SUT VIC AB 0 CT1 27 (SUTURE)
SUT VIC AB 0 CT1 27XBRD ANTBC (SUTURE) IMPLANT
SUT VIC AB 2-0 CT1 27 (SUTURE)
SUT VIC AB 2-0 CT1 TAPERPNT 27 (SUTURE) IMPLANT
SUT VIC AB 4-0 PS2 18 (SUTURE) ×6 IMPLANT
SYR 10ML LL (SYRINGE) IMPLANT
TOWEL OR NON WOVEN STRL DISP B (DISPOSABLE) IMPLANT
TRAP SPECIMEN MUCUS 40CC (MISCELLANEOUS) IMPLANT
TRAY FOLEY MTR SLVR 16FR STAT (SET/KITS/TRAYS/PACK) ×3 IMPLANT
TROCAR XCEL NON-BLD 5MMX100MML (ENDOMECHANICALS) IMPLANT
UNDERPAD 30X36 HEAVY ABSORB (UNDERPADS AND DIAPERS) ×6 IMPLANT
WATER STERILE IRR 1000ML POUR (IV SOLUTION) ×3 IMPLANT
YANKAUER SUCT BULB TIP 10FT TU (MISCELLANEOUS) IMPLANT

## 2021-08-29 NOTE — Anesthesia Preprocedure Evaluation (Addendum)
Anesthesia Evaluation  Patient identified by MRN, date of birth, ID band Patient awake    Reviewed: Allergy & Precautions, NPO status , Patient's Chart, lab work & pertinent test results  Airway Mallampati: II  TM Distance: >3 FB Neck ROM: Full    Dental  (+) Chipped, Dental Advisory Given,    Pulmonary neg pulmonary ROS,    Pulmonary exam normal breath sounds clear to auscultation       Cardiovascular hypertension, Pt. on medications Normal cardiovascular exam Rhythm:Regular Rate:Normal     Neuro/Psych negative neurological ROS  negative psych ROS   GI/Hepatic negative GI ROS, Neg liver ROS,   Endo/Other  Morbid obesity (BMI 40)  Renal/GU negative Renal ROS  negative genitourinary   Musculoskeletal negative musculoskeletal ROS (+)   Abdominal   Peds  Hematology negative hematology ROS (+)   Anesthesia Other Findings   Reproductive/Obstetrics                           Anesthesia Physical Anesthesia Plan  ASA: 3  Anesthesia Plan: General   Post-op Pain Management: Tylenol PO (pre-op) and Ketamine IV   Induction: Intravenous  PONV Risk Score and Plan: 3 and Midazolam, Dexamethasone and Ondansetron  Airway Management Planned: Oral ETT  Additional Equipment:   Intra-op Plan:   Post-operative Plan: Extubation in OR  Informed Consent: I have reviewed the patients History and Physical, chart, labs and discussed the procedure including the risks, benefits and alternatives for the proposed anesthesia with the patient or authorized representative who has indicated his/her understanding and acceptance.     Dental advisory given  Plan Discussed with: CRNA  Anesthesia Plan Comments: (2 IVs)       Anesthesia Quick Evaluation

## 2021-08-29 NOTE — Transfer of Care (Signed)
Immediate Anesthesia Transfer of Care Note  Patient: Lindsey Wallace  Procedure(s) Performed: XI ROBOTIC ASSISTED BILATERAL SALPINGO OOPHORECTOMY TOTAL HYSTERECTOMY (Bilateral: Abdomen) SENTINEL NODE BIOPSY (Abdomen) POSSIBLE LYMPH NODE DISSECTION (Abdomen)  Patient Location: PACU  Anesthesia Type:General  Level of Consciousness: awake, alert , oriented, drowsy and patient cooperative  Airway & Oxygen Therapy: Patient Spontanous Breathing and Patient connected to face mask oxygen  Post-op Assessment: Report given to RN and Post -op Vital signs reviewed and stable  Post vital signs: Reviewed and stable  Last Vitals:  Vitals Value Taken Time  BP 162/81 1747  Temp    Pulse 79   Resp 19   SpO2 100     Last Pain:  Vitals:   08/29/21 1306  TempSrc:   PainSc: 0-No pain      Patients Stated Pain Goal: 3 (33/29/51 8841)  Complications: No notable events documented.

## 2021-08-29 NOTE — Interval H&P Note (Signed)
History and Physical Interval Note:  08/29/2021 1:41 PM  Lindsey Wallace  has presented today for surgery, with the diagnosis of ENDOMETRIAL CANCER.  The various methods of treatment have been discussed with the patient and family. After consideration of risks, benefits and other options for treatment, the patient has consented to  Procedure(s): XI ROBOTIC ASSISTED BILATERAL SALPINGO OOPHORECTOMY,POSSIBLE LAPAROTOMY (Bilateral) SENTINEL NODE BIOPSY (N/A) POSSIBLE LYMPH NODE DISSECTION (N/A) as a surgical intervention.  The patient's history has been reviewed, patient examined, no change in status, stable for surgery.  I have reviewed the patient's chart and labs.  Questions were answered to the patient's satisfaction.     Lindsey Wallace

## 2021-08-29 NOTE — Anesthesia Postprocedure Evaluation (Signed)
Anesthesia Post Note  Patient: Lindsey Wallace  Procedure(s) Performed: XI ROBOTIC ASSISTED BILATERAL SALPINGO OOPHORECTOMY TOTAL HYSTERECTOMY (Bilateral: Abdomen) SENTINEL NODE BIOPSY (Abdomen) POSSIBLE LYMPH NODE DISSECTION (Abdomen)     Patient location during evaluation: PACU Anesthesia Type: General Level of consciousness: awake and alert Pain management: pain level controlled Vital Signs Assessment: post-procedure vital signs reviewed and stable Respiratory status: spontaneous breathing, nonlabored ventilation, respiratory function stable and patient connected to nasal cannula oxygen Cardiovascular status: blood pressure returned to baseline and stable Postop Assessment: no apparent nausea or vomiting Anesthetic complications: no   No notable events documented.  Last Vitals:  Vitals:   08/29/21 1800 08/29/21 1815  BP: (!) 156/70 (!) 164/82  Pulse: 75 80  Resp: 13 13  Temp:    SpO2: 97% 97%    Last Pain:  Vitals:   08/29/21 1815  TempSrc:   PainSc: 0-No pain                 Coady Train L Anais Koenen

## 2021-08-29 NOTE — Discharge Instructions (Addendum)
08/29/2021  Activity: 1. Be up and out of the bed during the day.  Take a nap if needed.  You may walk up steps but be careful and use the hand rail.  Stair climbing will tire you more than you think, you may need to stop part way and rest.   2. No lifting or straining for 6 weeks.  3. No driving for until off narcotics and you can brake safely. For most patients, this is 1-2 weeks.  Do Not drive if you are taking narcotic pain medicine.  4. Shower daily.  Use soap and water on your incision and pat dry; don't rub.   5. No sexual activity and nothing in the vagina for 8+ weeks.  Medications:  - Take ibuprofen and tylenol first line for pain control. Take these regularly (every 6 hours) to decrease the build up of pain.  - If necessary, for severe pain not relieved by ibuprofen, take tramadol.  - While taking tramadol you should take sennakot every night to reduce the likelihood of constipation. If this causes diarrhea, stop its use.  Diet: 1. Low sodium Heart Healthy Diet is recommended.  2. It is safe to use a laxative if you have difficulty moving your bowels.   Wound Care: 1. Keep clean and dry.  Shower daily.  Reasons to call the Doctor:  Fever - Oral temperature greater than 100.4 degrees Fahrenheit Foul-smelling vaginal discharge Difficulty urinating Nausea and vomiting Increased pain at the site of the incision that is unrelieved with pain medicine. Difficulty breathing with or without chest pain New calf pain especially if only on one side Sudden, continuing increased vaginal bleeding with or without clots.   Follow-up: 1. See [name] on 1/30. Phone visit on 1/18.  Contacts: For questions or concerns you should contact:  Dr. [name] at [phone] After hours and on week-ends call [phone] and ask to speak to the physician on call for Gynecologic Oncology

## 2021-08-29 NOTE — Anesthesia Procedure Notes (Signed)
Procedure Name: Intubation Date/Time: 08/29/2021 2:39 PM Performed by: Darris Staiger D, CRNA Pre-anesthesia Checklist: Patient identified, Emergency Drugs available, Suction available and Patient being monitored Patient Re-evaluated:Patient Re-evaluated prior to induction Oxygen Delivery Method: Circle system utilized Preoxygenation: Pre-oxygenation with 100% oxygen Induction Type: IV induction Ventilation: Mask ventilation without difficulty Laryngoscope Size: Mac and 3 Grade View: Grade II Tube type: Oral Tube size: 7.0 mm Number of attempts: 1 Airway Equipment and Method: Stylet Placement Confirmation: ETT inserted through vocal cords under direct vision, positive ETCO2 and breath sounds checked- equal and bilateral Tube secured with: Tape Dental Injury: Teeth and Oropharynx as per pre-operative assessment

## 2021-08-29 NOTE — Op Note (Addendum)
OPERATIVE NOTE  Pre-operative Diagnosis: endometrial cancer grade 3  Post-operative Diagnosis: same, pelvic fibrosis  Operation: Robotic-assisted laparoscopic total hysterectomy with bilateral salpingoophorectomy, SLN biopsy Modifier 22: Extreme morbid obesity requiring additional OR personnel for positioning and retraction. Obesity made retroperitoneal visualization limited and increased the complexity of the case and necessitated additional instrumentation for retraction. Obesity related complexity increased the duration of the procedure by 45 minutes.    Surgeon: [name] MD  Assistant Surgeon: Lahoma Crocker MD (an MD assistant was necessary for tissue manipulation, management of robotic instrumentation, retraction and positioning due to the complexity of the case and hospital policies).   Anesthesia: GET  Urine Output: 350cc  Operative Findings: On EUA, 8 cm mobile uterus.  On intra-abdominal entry, normal upper abdominal survey including liver edge, diaphragm, and stomach.  Evidence of prior gastric bypass.  Clips also noted within the pelvis from bypass surgery.  No ascites.  Normal-appearing small and large bowel.  Normal-appearing fallopian tubes.  Bilateral ovaries with filmy adhesions to the medial leaf of the broad ligament and posterior uterus.  Some findings of what appeared to be endosalpingosis along the posterior uterus.  Significant adhesive disease with almost an unidentifiable plane between the bladder and the cervix with a very long cervix.  No frank tumor involvement of the cervix.  No definite evidence of bladder invasion on cystoscopy after.  Mapping successful to bilateral pelvic basins although sentinel lymph node somewhat difficult to discern on either side.  Lymph node removed where end of green lymphatic chain noted.  This was a right external iliac node and a left obturator node.  The left sentinel lymph node was removed with several surrounding lymph nodes  due to its location.  On cystoscopy, dome intact, no suture noted in the bladder, strong bilateral ureteral reflux.  Rectal exam normal.  Estimated Blood Loss:  200cc      Total IV Fluids: see I&O flowsheet         Specimens: uterus, cervix, bilateral tubes and ovaries, right external iliac SLN, left obturator SLN         Complications:  None apparent; patient tolerated the procedure well.         Disposition: PACU - hemodynamically stable.  Procedure Details  The patient was seen in the Holding Room. The risks, benefits, complications, treatment options, and expected outcomes were discussed with the patient.  The patient concurred with the proposed plan, giving informed consent.  The site of surgery properly noted/marked. The patient was identified as Scientist, physiological and the procedure verified as a Robotic-assisted hysterectomy with bilateral salpingo oophorectomy with SLN biopsy.   After induction of anesthesia, the patient was draped and prepped in the usual sterile manner. Patient was placed in supine position after anesthesia and draped and prepped in the usual sterile manner as follows: Her arms were tucked to her side with all appropriate precautions.  The shoulders were stabilized with padded shoulder blocks applied to the acromium processes.  The patient was placed in the semi-lithotomy position in [place].  The perineum and vagina were prepped with CholoraPrep. The patient was draped after the CholoraPrep had been allowed to dry for 3 minutes.  A Time Out was held and the above information confirmed.  The urethra was prepped with Betadine. Foley catheter was placed.  A sterile speculum was placed in the vagina.  The cervix was grasped with a single-tooth tenaculum. 2mg  total of ICG was injected into the cervical stroma at 2 and  9 o'clock with 1cc injected at a 1cm and 14mm depth (concentration 0.5mg /ml) in all locations. The cervix was dilated with Kennon Rounds dilators.  The ZUMI  uterine manipulator with a medium colpotomizer ring was placed without difficulty.  A pneum occluder balloon was placed over the manipulator.  OG tube placement was confirmed and to suction.   Next, a 10 mm skin incision was made 1 cm below the subcostal margin in the midclavicular line.  The 5 mm Optiview port and scope was used for direct entry.  Opening pressure was under 10 mm CO2.  The abdomen was insufflated and the findings were noted as above.   At this point and all points during the procedure, the patient's intra-abdominal pressure did not exceed 15 mmHg. Next, an 8 mm skin incision was made superior the umbilicus and a right and left port were placed about 8 cm lateral to the robot port on the right and left side.  A fourth arm was placed on the right.  The 5 mm assist trocar was exchanged for a 10-12 mm port. All ports were placed under direct visualization.  The patient was placed in steep Trendelenburg.  Bowel was folded away into the upper abdomen.  The robot was docked in the normal manner.  The right and left peritoneum were opened parallel to the IP ligament to open the retroperitoneal spaces bilaterally. The round ligaments were transected. The SLN mapping was performed in bilateral pelvic basins. After identifying the ureters, the para rectal and paravesical spaces were opened up entirely with careful dissection below the level of the ureters bilaterally and to the depth of the uterine artery origin in order to skeletonize the uterine "web" and ensure visualization of all parametrial channels. The para-aortic basins were carefully exposed and evaluated for isolated para-aortic SLN's. Lymphatic channels were identified travelling to the following visualized sentinel lymph node's: Right external iliac and left obturator. These SLN's were separated from their surrounding lymphatic tissue, removed and sent for permanent pathology.  Left obturator sentinel lymph node was removed with several  surrounding lymph nodes given its location.  The hysterectomy was started.  The ureter was again noted to be on the medial leaf of the broad ligament.  The peritoneum above the ureter was incised and stretched and the infundibulopelvic ligament was skeletonized, cauterized and cut.  The ovaries and 1 fallopian tube were freed from attachments to medial leaf of the broad ligament and posterior uterus.  The posterior peritoneum was taken down to the level of the KOH ring.  The posterior ring was quite challenging to see.  Ultimately to see the ring required manual manipulation on the [facility] ring itself.  The anterior peritoneum was also taken down.  The bladder flap was created to the level of the KOH ring with quite some difficulty given significant adhesions between the bladder and cervix.  The uterine artery on the right side was skeletonized, cauterized and cut in the normal manner.  A similar procedure was performed on the left.  The colpotomy was made and the uterus, cervix, bilateral ovaries and tubes were amputated and delivered through the vagina.  Pedicles were inspected and excellent hemostasis was achieved.  Rectum was somewhat tethered to the posterior cul-de-sac inferior to the cervix.  An additional margin of cervix was resected with care taken not to injure the rectum.  A rectal exam was performed to assure no injury during colpotomy to the rectum.  The colpotomy at the vaginal cuff was closed with  Vicryl on a CT1 needle in running manner.  Irrigation was used and excellent hemostasis was achieved.  Given raw surgical bed within the deep pelvis where the rectovaginal space had been developed during colpotomy, Floseal was placed.  At this point in the procedure was completed.  Robotic instruments were removed under direct visulaization.  The robot was undocked. The fascia at the 10-12 mm port was closed with 0 Vicryl on a UR-5 needle.  The subcuticular tissue was closed with 4-0 Vicryl and the skin was  closed with 4-0 Monocryl in a subcuticular manner.  Dermabond was applied.    Foley catheter was removed.  Cystoscopy was performed with sterile fluid with findings as noted above.  The vagina was swabbed with minimal bleeding noted.   All sponge, lap and needle counts were correct x  3.   The patient was transferred to the recovery room in stable condition.  [name], MD

## 2021-08-30 ENCOUNTER — Telehealth: Payer: Self-pay

## 2021-08-30 ENCOUNTER — Encounter (HOSPITAL_COMMUNITY): Payer: Self-pay | Admitting: Gynecologic Oncology

## 2021-08-30 NOTE — Telephone Encounter (Signed)
Spoke with Lindsey Wallace this morning. She states she is eating, drinking and urinating well. She has not had a BM yet but is passing gas. She is taking senokot as prescribed and encouraged her to drink plenty of water. She denies fever or chills. Incisions are dry and intact. She rates her pain 1/10. Her pain is controlled with ibuprofen. She states she has not needed to take any ibuprofen today and feels good.     Instructed to call office with any fever, chills, purulent drainage, uncontrolled pain or any other questions or concerns. Patient verbalizes understanding.   Pt aware of post op appointments as well as the office number [phone] and after hours number [phone] to call if she has any questions or concerns

## 2021-09-01 ENCOUNTER — Other Ambulatory Visit: Payer: Self-pay

## 2021-09-01 ENCOUNTER — Encounter: Payer: Self-pay | Admitting: Gynecologic Oncology

## 2021-09-01 ENCOUNTER — Inpatient Hospital Stay: Payer: Medicare PPO | Attending: Gynecologic Oncology | Admitting: Gynecologic Oncology

## 2021-09-01 DIAGNOSIS — Z90722 Acquired absence of ovaries, bilateral: Secondary | ICD-10-CM

## 2021-09-01 DIAGNOSIS — C541 Malignant neoplasm of endometrium: Secondary | ICD-10-CM

## 2021-09-01 DIAGNOSIS — Z7189 Other specified counseling: Secondary | ICD-10-CM

## 2021-09-01 DIAGNOSIS — Z90710 Acquired absence of both cervix and uterus: Secondary | ICD-10-CM

## 2021-09-01 NOTE — Progress Notes (Signed)
Gynecologic Oncology Telehealth Consult Note: Gyn-Onc  I connected with Lindsey Wallace on 09/01/21 at  1:15 PM EST by telephone and verified that I am speaking with the correct person using two identifiers.  I discussed the limitations, risks, security and privacy concerns of performing an evaluation and management service by telemedicine and the availability of in-person appointments. I also discussed with the patient that there may be a patient responsible charge related to this service. The patient expressed understanding and agreed to proceed.  Other persons participating in the visit and their role in the encounter: None.  Patient's location: Home Provider's location: [facility]  Reason for Visit: Follow-up after surgery, pathology review  Treatment History: 1/10: Robotic assisted TLH/BSO, sentinel lymph node biopsy for high-grade endometrial cancer  Interval History: Patient reports doing very well since surgery.  She denies any pain, has not had to use narcotics.  Reports having a bowel movement today.  Denies any bladder issues.  Denies vaginal bleeding or discharge.  Tolerating diet without nausea or emesis.  Past Medical/Surgical History: Past Medical History:  Diagnosis Date   BMI 39.0-39.9,adult    endometrial ca 07/2021   HLD (hyperlipidemia)    Hypertension    Pneumonia    remote history    Past Surgical History:  Procedure Laterality Date   DILATION AND CURETTAGE OF UTERUS  08/03/2021   EYE SURGERY     LYMPH NODE DISSECTION N/A 08/29/2021   Procedure: POSSIBLE LYMPH NODE DISSECTION;  Surgeon: Lafonda Mosses, MD;  Location: [facility];  Service: Gynecology;  Laterality: N/A;   ROBOTIC ASSISTED BILATERAL SALPINGO OOPHERECTOMY Bilateral 08/29/2021   Procedure: XI ROBOTIC ASSISTED BILATERAL SALPINGO OOPHORECTOMY TOTAL HYSTERECTOMY;  Surgeon: Lafonda Mosses, MD;  Location: [facility];  Service: Gynecology;  Laterality: Bilateral;   SENTINEL NODE  BIOPSY N/A 08/29/2021   Procedure: SENTINEL NODE BIOPSY;  Surgeon: Lafonda Mosses, MD;  Location: [facility];  Service: Gynecology;  Laterality: N/A;    Family History  Problem Relation Age of Onset   Uterine cancer Maternal Grandmother    Uterine cancer Paternal Grandmother    Leukemia Maternal Aunt    Colon cancer Neg Hx    Breast cancer Neg Hx    Ovarian cancer Neg Hx    Endometrial cancer Neg Hx    Pancreatic cancer Neg Hx    Prostate cancer Neg Hx     Social History   Socioeconomic History   Marital status: Married    Spouse name: Not on file   Number of children: Not on file   Years of education: Not on file   Highest education level: Not on file  Occupational History   Not on file  Tobacco Use   Smoking status: Never   Smokeless tobacco: Not on file  Vaping Use   Vaping Use: Never used  Substance and Sexual Activity   Alcohol use: Yes    Comment: glass of wine here and there   Drug use: No   Sexual activity: Yes    Birth control/protection: Post-menopausal  Other Topics Concern   Not on file  Social History Narrative   Not on file   Social Determinants of Health   Financial Resource Strain: Not on file  Food Insecurity: Not on file  Transportation Needs: Not on file  Physical Activity: Not on file  Stress: Not on file  Social Connections: Not on file    Current Medications:  Current Outpatient Medications:    latanoprost (XALATAN) 0.005 %  ophthalmic solution, Place 1 drop into both eyes at bedtime., Disp: , Rfl:    lisinopril (PRINIVIL,ZESTRIL) 10 MG tablet, Take 10 mg by mouth daily., Disp: , Rfl:    Multiple Vitamins-Minerals (MULTIVITAMINS THER. W/MINERALS) TABS, Take 1 tablet by mouth daily., Disp: , Rfl:    pravastatin (PRAVACHOL) 40 MG tablet, Take 40 mg by mouth every morning., Disp: , Rfl:    senna-docusate (SENOKOT-S) 8.6-50 MG tablet, Take 2 tablets by mouth at bedtime. For AFTER surgery, do not take if having diarrhea, Disp: 30 tablet,  Rfl: 0   traMADol (ULTRAM) 50 MG tablet, Take 1 tablet (50 mg total) by mouth every 6 (six) hours as needed for severe pain. For AFTER surgery, do not take and drive, Disp: 10 tablet, Rfl: 0  Review of Symptoms: Complete 10-system review is negative except as above in Interval History.  Physical Exam: There were no vitals taken for this visit. Deferred given limitations of phone visit.  Laboratory & Radiologic Studies: A. LEFT OBTURATOR SENTINEL LYMPH NODE AND SURROUNDING NODES, EXCISION:  -  No carcinoma identified in four lymph nodes (0/4)  -  See comment   B. UTERUS, CERVIX, BILATERAL FALLOPIAN TUBES AND OVARIES:  Uterus:  -  Endometrioid carcinoma, FIGO grade 2  -  Benign endometrial polyp  -  Leiomyomata (1.2 cm; largest)  -  Adenomyosis  -  See oncology table and comment below   Cervix:  -  No carcinoma identified   Bilateral Ovaries:  -  No carcinoma identified   Bilateral Fallopian tubes:  -  No carcinoma identified   C. SENTINEL LYMPH NODE, RIGHT EXTERNAL ILIAC, EXCISION:  -  No carcinoma identified in one lymph node (0/1)  -  See comment   ONCOLOGY TABLE:    UTERUS, CARCINOMA OR CARCINOSARCOMA: Resection   Procedure: Total hysterectomy and bilateral salpingo-oophorectomy  Histologic Type: Endometrioid carcinoma  Histologic Grade: FIGO grade 2  Myometrial Invasion:       Depth of Myometrial Invasion (mm): 4 mm       Myometrial Thickness (mm): 20 mm       Percentage of Myometrial Invasion: <50%  Uterine Serosa Involvement: Not identified  Cervical stromal Involvement: Not identified  Extent of involvement of other tissue/organs: Not identified  Peritoneal/Ascitic Fluid: Not submitted/unknown  Lymphovascular Invasion: Not identified  Regional Lymph Nodes:       Pelvic Lymph Nodes Examined:            5 Sentinel                          0 Non-sentinel                          5 Total       Number of lymph nodes involved by carcinoma:0       Number of  lymph nodes with isolsated tumor cells:  Distant Metastasis:       Distant Site(s) Involved: Not applicable  Pathologic Stage Classification (pTNM, AJCC 8th Edition): pT1a, pN0  Ancillary Studies: MMR / MSI testing will be ordered  Representative Tumor Block: b14  Comment(s): Pancytokeratin was performed on the lymph nodes and is  negative (blocks A1-3; C1).   Assessment & Plan: Lindsey Wallace is a 71 y.o. 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  Patient is doing very well and meeting postoperative milestones.  Discussed continued expectations as well as restrictions.  She is interested in terms of going back to work.  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  I encouraged her to take 2 weeks off completely but if she is feeling well enough after 2 weeks, she could go back to work.  Discussed her pathology.  She has early stage cancer but meets high-intermediate risk criteria given her age and grade of the tumor.  Given this, we discussed benefit in terms of reduction in risk of recurrence by adjuvant treatment.  I recommend adjuvant vaginal brachytherapy to decrease the risk of vaginal cuff recurrence.  Patient is amenable to referral to radiation oncology.  She is aware of in person follow-up with me later this month.  I discussed the assessment and treatment plan with the patient. The patient was provided with an opportunity to ask questions and all were answered. The patient agreed with the plan and demonstrated an understanding of the instructions.   The patient was advised to call back or see an in-person evaluation if the symptoms worsen or if the condition fails to improve as anticipated.   18 minutes of total time was spent for this patient encounter, including preparation, face-to-face counseling with the patient and coordination of care,  and documentation of the encounter.   [name], MD  Division of Gynecologic Oncology  Department of Obstetrics and Gynecology  [facility]

## 2021-09-04 ENCOUNTER — Encounter: Payer: Self-pay | Admitting: Oncology

## 2021-09-04 DIAGNOSIS — C541 Malignant neoplasm of endometrium: Secondary | ICD-10-CM

## 2021-09-04 NOTE — Progress Notes (Signed)
Referral placed for Radiation Oncology to see Dr. Sondra Come per Dr. Berline Lopes.

## 2021-09-05 LAB — SURGICAL PATHOLOGY

## 2021-09-06 ENCOUNTER — Telehealth: Payer: Self-pay | Admitting: *Deleted

## 2021-09-06 ENCOUNTER — Telehealth: Payer: Federal, State, Local not specified - PPO | Admitting: Gynecologic Oncology

## 2021-09-07 NOTE — Progress Notes (Signed)
GYN Location of Tumor / Histology: Endometrial  Mariann Laster Acker-Rothwell presented with symptoms of:  postmenopausal bleeding  Biopsies revealed:  A. LEFT OBTURATOR SENTINEL LYMPH NODE AND SURROUNDING NODES, EXCISION:  -  No carcinoma identified in four lymph nodes (0/4)  -  See comment   B. UTERUS, CERVIX, BILATERAL FALLOPIAN TUBES AND OVARIES:  Uterus:  -  Endometrioid carcinoma, FIGO grade 2  -  Benign endometrial polyp  -  Leiomyomata (1.2 cm; largest)  -  Adenomyosis  -  See oncology table and comment below   Cervix:  -  No carcinoma identified   Bilateral Ovaries:  -  No carcinoma identified   Bilateral Fallopian tubes:  -  No carcinoma identified   C. SENTINEL LYMPH NODE, RIGHT EXTERNAL ILIAC, EXCISION:  -  No carcinoma identified in one lymph node (0/1)  -  See comment   Past/Anticipated interventions by Gyn/Onc surgery, if any:  Operation: Robotic-assisted laparoscopic total hysterectomy with bilateral salpingoophorectomy, SLN biopsy Modifier 22: Extreme morbid obesity requiring additional OR personnel for positioning and retraction. Obesity made retroperitoneal visualization limited and increased the complexity of the case and necessitated additional instrumentation for retraction. Obesity related complexity increased the duration of the procedure by 45 minutes.   Surgeon: [name] MD  Past/Anticipated interventions by medical oncology, if any: not at this time  Weight changes, if any: no  Bowel/Bladder complaints, if any: Yes.  ,  nocturia x1  Nausea/Vomiting, if any: no  Pain issues, if any:  no  SAFETY ISSUES: Prior radiation? no Pacemaker/ICD? no Possible current pregnancy? no, hysterectomy Is the patient on methotrexate? no  Current Complaints / other details:  none  Vitals:   09/13/21 0838  BP: (!) 148/86  Pulse: 72  Resp: 18  Temp: (!) 97 F (36.1 C)  TempSrc: Temporal  SpO2: 100%  Weight: 266 lb 4 oz (120.8 kg)  Height: 5' 8.5"  (1.74 m)

## 2021-09-12 NOTE — Progress Notes (Signed)
Radiation Oncology         [phone] ________________________________  Initial Outpatient Consultation  Name: Lindsey Wallace MRN: [id number]  Date: 09/13/2021  DOB: 1950/10/29  MI:WOEHOZY, Lindsey Sheffield, MD  Lafonda Mosses, MD   REFERRING PHYSICIAN: Lafonda Mosses, MD  DIAGNOSIS: The encounter diagnosis was Endometrial carcinoma [facility]).  Endometrial Cancer - FIGO grade 2 endometrioid carcinoma, Stage pT1a, pN0  HISTORY OF PRESENT ILLNESS::Lindsey Wallace is a 71 y.o. female who is seen as a courtesy of Dr. Berline Lopes for an opinion concerning radiation therapy as part of management for her recently diagnosed endometrial cancer.   The patient presented this past September to Dr. Lowella Dell with postmenopausal bleeding. Pelvic US on 05/09/21 showed a uterus measuring 8.9 x 4.8 x 3.9 cm, and an endometrial lining measuring 14 mm.  A normal-appearing adnexa was also appreciated without masses.   Subsequently, she underwent an endometrial biopsy on 05/24/21 which revealed focal simple hyperplasia with atypia, and fragments of polypoid inactive endometrium.  No malignancy was identified.  Given evidence of hyperplasia on imaging, the patient opted to proceed with hysteroscopy and polypectomy on 08/03/21.  Pathology revealed endometrioid endometrial adenocarcinoma, FIGO grade 3.  Findings also included endometrial intraepithelial neoplasia.  Subsequently, the patient was referred to Dr. Berline Lopes on 08/18/21. During this visit, the patient reported initial onset of postmenopausal bleeding in August of 2022. When her bleeding first began, the patient reported ane episode of heavy spotting followed by no bleeding for 1 week. She then would have small amounts of spotting randomly and not daily.  Her bleeding ceased after her first biopsy in October. Pelvic exam performed during this visit revealed no abnormal findings.   CT of the chest abdomen and pelvis on 08/21/21 demonstrated no evidence  of metastatic endometrial carcinoma or abdominopelvic adenopathy. CT also showed some tiny nonspecific right lung nodules which were not highly suspicious in appearance. The uterus and adnexa were also appreciated as unremarkable.  Per Dr. Charisse March recommendation, the patient opted to proceed with hysterectomy, BSO, and nodal biopsies on 08/29/21. Pathology from the procedure revealed FIGO grade 2 endometrioid carcinoma of the uterus (MSI-stable), with a benign endometrial polyp, leiomyomata (1.2 cm; largest), and adenomyosis. The cervix, bilateral ovaries, and fallopian tubes showed were all negative or carcinoma. Nodal status of 4/4 left obturator SNL excisions negative for carcinoma, and 1/1 right external iliac SNL excision negative for carcinoma.  During post-op follow up with Dr. Berline Lopes on 09/01/21, the patient was noted to be doing quite well. In terms of treatment options, Dr. Berline Lopes recommended adjuvant vaginal brachytherapy to decrease the risk of vaginal cuff recurrence.   PREVIOUS RADIATION THERAPY: No  PAST MEDICAL HISTORY:  Past Medical History:  Diagnosis Date   BMI 39.0-39.9,adult    endometrial ca 07/2021   HLD (hyperlipidemia)    Hypertension    Pneumonia    remote history    PAST SURGICAL HISTORY: Past Surgical History:  Procedure Laterality Date   DILATION AND CURETTAGE OF UTERUS  08/03/2021   EYE SURGERY     LYMPH NODE DISSECTION N/A 08/29/2021   Procedure: POSSIBLE LYMPH NODE DISSECTION;  Surgeon: Lafonda Mosses, MD;  Location: [facility];  Service: Gynecology;  Laterality: N/A;   ROBOTIC ASSISTED BILATERAL SALPINGO OOPHERECTOMY Bilateral 08/29/2021   Procedure: XI ROBOTIC ASSISTED BILATERAL SALPINGO OOPHORECTOMY TOTAL HYSTERECTOMY;  Surgeon: Lafonda Mosses, MD;  Location: [facility];  Service: Gynecology;  Laterality: Bilateral;   SENTINEL NODE BIOPSY N/A 08/29/2021   Procedure: SENTINEL NODE BIOPSY;  Surgeon: Lafonda Mosses, MD;  Location: [facility];  Service:  Gynecology;  Laterality: N/A;    FAMILY HISTORY:  Family History  Problem Relation Age of Onset   Uterine cancer Maternal Grandmother    Uterine cancer Paternal Grandmother    Leukemia Maternal Aunt    Colon cancer Neg Hx    Breast cancer Neg Hx    Ovarian cancer Neg Hx    Endometrial cancer Neg Hx    Pancreatic cancer Neg Hx    Prostate cancer Neg Hx     SOCIAL HISTORY:  Social History   Tobacco Use   Smoking status: Never  Vaping Use   Vaping Use: Never used  Substance Use Topics   Alcohol use: Yes    Comment: glass of wine here and there   Drug use: No    ALLERGIES:  Allergies  Allergen Reactions   Penicillins Rash    MEDICATIONS:  Current Outpatient Medications  Medication Sig Dispense Refill   latanoprost (XALATAN) 0.005 % ophthalmic solution Place 1 drop into both eyes at bedtime.     lisinopril (PRINIVIL,ZESTRIL) 10 MG tablet Take 10 mg by mouth daily.     Multiple Vitamins-Minerals (MULTIVITAMINS THER. W/MINERALS) TABS Take 1 tablet by mouth daily.     pravastatin (PRAVACHOL) 40 MG tablet Take 40 mg by mouth every morning.     senna-docusate (SENOKOT-S) 8.6-50 MG tablet Take 2 tablets by mouth at bedtime. For AFTER surgery, do not take if having diarrhea (Patient not taking: Reported on 09/13/2021) 30 tablet 0   traMADol (ULTRAM) 50 MG tablet Take 1 tablet (50 mg total) by mouth every 6 (six) hours as needed for severe pain. For AFTER surgery, do not take and drive (Patient not taking: Reported on 09/13/2021) 10 tablet 0   No current facility-administered medications for this encounter.    REVIEW OF SYSTEMS:  A 10+ POINT REVIEW OF SYSTEMS WAS OBTAINED including neurology, dermatology, psychiatry, cardiac, respiratory, lymph, extremities, GI, GU, musculoskeletal, constitutional, reproductive, HEENT.  She denies any further vaginal bleeding.  She denies any numbness along her thighs or swelling in her lower extremities since surgery.  She denies any pain within  the pelvis area, bowel or bladder issues.     PHYSICAL EXAM:  height is 5' 8.5" (1.74 m) and weight is 266 lb 4 oz (120.8 kg). Her temporal temperature is 97 F (36.1 C) (abnormal). Her blood pressure is 148/86 (abnormal) and her pulse is 72. Her respiration is 18 and oxygen saturation is 100%.   General: Alert and oriented, in no acute distress HEENT: Head is normocephalic. Extraocular movements are intact.  Neck: Neck is supple, no palpable cervical or supraclavicular lymphadenopathy. Heart: Regular in rate and rhythm with no murmurs, rubs, or gallops. Chest: Clear to auscultation bilaterally, with no rhonchi, wheezes, or rales. Abdomen: Soft, nontender, nondistended, with no rigidity or guarding. Extremities: No cyanosis or edema. Lymphatics: see Neck Exam Skin: No concerning lesions. Musculoskeletal: symmetric strength and muscle tone throughout. Neurologic: Cranial nerves II through XII are grossly intact. No obvious focalities. Speech is fluent. Coordination is intact. Psychiatric: Judgment and insight are intact. Affect is appropriate.  On pelvic examination deferred in light of recent surgery.  She will be examined by Dr. Berline Lopes on January 30.   ECOG = 1  0 - Asymptomatic (Fully active, able to carry on all predisease activities without restriction)  1 - Symptomatic but completely ambulatory (Restricted in physically strenuous activity but ambulatory and able to carry out  work of a light or sedentary nature. For example, light housework, office work)  2 - Symptomatic, <50% in bed during the day (Ambulatory and capable of all self care but unable to carry out any work activities. Up and about more than 50% of waking hours)  3 - Symptomatic, >50% in bed, but not bedbound (Capable of only limited self-care, confined to bed or chair 50% or more of waking hours)  4 - Bedbound (Completely disabled. Cannot carry on any self-care. Totally confined to bed or chair)  5 - Death    Eustace Pen MM, Creech RH, Tormey DC, et al. [phone]). "Toxicity and response criteria of the [facility]". [place] Oncol. 5 (6): 649-55  LABORATORY DATA:  Lab Results  Component Value Date   WBC 6.1 08/28/2021   HGB 12.9 08/28/2021   HCT 42.8 08/28/2021   MCV 72.3 (L) 08/28/2021   PLT 214 08/28/2021   Lab Results  Component Value Date   NA 141 08/18/2021   K 4.2 08/18/2021   CL 106 08/18/2021   CO2 29 08/18/2021   GLUCOSE 97 08/18/2021   CREATININE 0.87 08/18/2021   CALCIUM 9.4 08/18/2021      RADIOGRAPHY: CT CHEST ABDOMEN PELVIS W CONTRAST  Result Date: 08/22/2021 CLINICAL DATA:  Endometrial cancer.  Staging. EXAM: CT CHEST, ABDOMEN, AND PELVIS WITH CONTRAST TECHNIQUE: Multidetector CT imaging of the chest, abdomen and pelvis was performed following the standard protocol during bolus administration of intravenous contrast. CONTRAST:  49m OMNIPAQUE IOHEXOL 350 MG/ML SOLN COMPARISON:  None. FINDINGS: CT CHEST FINDINGS Cardiovascular: Atherosclerosis of the aorta, great vessels and coronary arteries. No acute vascular findings are demonstrated. The heart size is normal. There is no pericardial effusion. Mediastinum/Nodes: There are no enlarged mediastinal, hilar or axillary lymph nodes. The thyroid gland, trachea and esophagus demonstrate no significant findings. Lungs/Pleura: No pleural effusion or pneumothorax. There is a 4 mm right lower lobe nodule on image 89/6. Additional tiny right lung nodules are noted on images 50 and 95. No highly suspicious pulmonary nodules. Musculoskeletal/Chest wall: No chest wall mass or suspicious osseous findings. CT ABDOMEN AND PELVIS FINDINGS Hepatobiliary: The liver is normal in density without suspicious focal abnormality. Probable small dependent gallstones in the gallbladder. No evidence of gallbladder wall thickening, surrounding inflammation or biliary dilatation. Pancreas: Unremarkable. No pancreatic ductal dilatation or  surrounding inflammatory changes. Spleen: Normal in size without focal abnormality. Adrenals/Urinary Tract: Both adrenal glands appear normal. Probable mild renal cortical scarring bilaterally. Tiny nonobstructing right renal calculi. No evidence of ureteral calculus, hydronephrosis or renal mass. The bladder appears unremarkable. Stomach/Bowel: Enteric contrast was administered and has passed to the level of the mid colon. Evidence of previous gastric bypass procedure. No evidence of bowel wall thickening, distention or surrounding inflammation. The appendix appears normal. Vascular/Lymphatic: There are no enlarged abdominal or pelvic lymph nodes. Moderate aortic and branch vessel atherosclerosis without acute vascular findings. Reproductive: The uterus and cervix appear unremarkable. No evidence of endometrial thickening or parametrial tumor by CT. No adnexal mass. Other: No evidence of abdominal wall mass or hernia. No ascites. Musculoskeletal: No acute or significant osseous findings. Mild thoracolumbar spondylosis. IMPRESSION: 1. No evidence of metastatic endometrial carcinoma. Specifically, no abdominopelvic adenopathy identified. 2. Tiny right lung nodules are nonspecific and not highly suspect. Fleischner criteria do not apply. Suggest attention on follow-up. 3. The uterus and adnexa appear unremarkable by CT. 4. Status post gastric bypass. Cholelithiasis and small nonobstructing right renal calculi. 5.  Aortic Atherosclerosis (ICD10-I70.0).  Electronically Signed   By: Richardean Sale M.D.   On: 08/22/2021 12:07      IMPRESSION: Endometrial Cancer - FIGO grade 2 endometrioid carcinoma, Stage pT1a, pN0  Based on the pathologic findings and her age the patient would be considered high-intermediate risk for recurrence and would recommend vaginal brachytherapy to reduce chances of recurrence.  Today, I talked to the patient  about the findings and work-up thus far.  We discussed the natural history of  endometrial cancer and general treatment, highlighting the role of radiotherapy in the management.  We discussed the available radiation techniques, and focused on the details of logistics and delivery.  We reviewed the anticipated acute and late sequelae associated with radiation in this setting.  The patient was encouraged to ask questions that I answered to the best of my ability.  A patient consent form was discussed and signed.  We retained a copy for our records.  The patient would like to proceed with radiation and will be scheduled for CT simulation.  PLAN: Patient will be scheduled to initiate vaginal brachytherapy approximately 6 weeks postop unless she requires additional healing for exam with Dr. Berline Lopes later this month.  Would recommend 5 high-dose-rate treatments directed at the vaginal cuff.  Iridium 192 will be the high-dose-rate source.   60 minutes of total time was spent for this patient encounter, including preparation, face-to-face counseling with the patient and coordination of care, physical exam, and documentation of the encounter.   ------------------------------------------------  Blair Promise, PhD, MD  This document serves as a record of services personally performed by Gery Pray, MD. It was created on his behalf by Roney Mans, a trained medical scribe. The creation of this record is based on the scribe's personal observations and the provider's statements to them. This document has been checked and approved by the attending provider.

## 2021-09-13 ENCOUNTER — Encounter: Payer: Self-pay | Admitting: Radiation Oncology

## 2021-09-13 ENCOUNTER — Other Ambulatory Visit: Payer: Self-pay

## 2021-09-13 ENCOUNTER — Ambulatory Visit
Admission: RE | Admit: 2021-09-13 | Discharge: 2021-09-13 | Disposition: A | Discharge status: Home / Self Care | Payer: Medicare PPO | Source: Ambulatory Visit | Attending: Radiation Oncology | Admitting: Radiation Oncology

## 2021-09-13 VITALS — BP 148/86 | HR 72 | Temp 97.0°F | Resp 18 | Ht 68.5 in | Wt 266.2 lb

## 2021-09-13 DIAGNOSIS — I10 Essential (primary) hypertension: Secondary | ICD-10-CM | POA: Insufficient documentation

## 2021-09-13 DIAGNOSIS — I700 Atherosclerosis of aorta: Secondary | ICD-10-CM | POA: Diagnosis not present

## 2021-09-13 DIAGNOSIS — N200 Calculus of kidney: Secondary | ICD-10-CM | POA: Diagnosis not present

## 2021-09-13 DIAGNOSIS — K8020 Calculus of gallbladder without cholecystitis without obstruction: Secondary | ICD-10-CM | POA: Insufficient documentation

## 2021-09-13 DIAGNOSIS — E785 Hyperlipidemia, unspecified: Secondary | ICD-10-CM | POA: Insufficient documentation

## 2021-09-13 DIAGNOSIS — C541 Malignant neoplasm of endometrium: Secondary | ICD-10-CM

## 2021-09-13 DIAGNOSIS — Z79899 Other long term (current) drug therapy: Secondary | ICD-10-CM | POA: Insufficient documentation

## 2021-09-13 DIAGNOSIS — R918 Other nonspecific abnormal finding of lung field: Secondary | ICD-10-CM | POA: Insufficient documentation

## 2021-09-13 NOTE — Progress Notes (Signed)
See MD note for nursing evaluation. °

## 2021-09-14 ENCOUNTER — Telehealth: Payer: Self-pay | Admitting: *Deleted

## 2021-09-14 NOTE — Telephone Encounter (Signed)
CALLED PATIENT TO INFORM OF NEW HDR [facility], SPOKE WITH PATIENT AND SHE IS AWARE OF THESE APPTS. °

## 2021-09-15 DIAGNOSIS — E785 Hyperlipidemia, unspecified: Secondary | ICD-10-CM | POA: Diagnosis not present

## 2021-09-15 DIAGNOSIS — M7501 Adhesive capsulitis of right shoulder: Secondary | ICD-10-CM | POA: Diagnosis not present

## 2021-09-15 DIAGNOSIS — I10 Essential (primary) hypertension: Secondary | ICD-10-CM | POA: Diagnosis not present

## 2021-09-15 DIAGNOSIS — C541 Malignant neoplasm of endometrium: Secondary | ICD-10-CM | POA: Diagnosis not present

## 2021-09-15 NOTE — Telephone Encounter (Signed)
Error

## 2021-09-18 ENCOUNTER — Encounter: Payer: Self-pay | Admitting: *Deleted

## 2021-09-18 ENCOUNTER — Other Ambulatory Visit: Payer: Self-pay

## 2021-09-18 ENCOUNTER — Inpatient Hospital Stay (HOSPITAL_BASED_OUTPATIENT_CLINIC_OR_DEPARTMENT_OTHER): Payer: Medicare PPO | Admitting: Gynecologic Oncology

## 2021-09-18 ENCOUNTER — Encounter: Payer: Self-pay | Admitting: Gynecologic Oncology

## 2021-09-18 VITALS — BP 156/74 | HR 80 | Temp 97.3°F | Resp 16 | Ht 68.0 in | Wt 270.0 lb

## 2021-09-18 DIAGNOSIS — C541 Malignant neoplasm of endometrium: Secondary | ICD-10-CM

## 2021-09-18 DIAGNOSIS — Z90722 Acquired absence of ovaries, bilateral: Secondary | ICD-10-CM

## 2021-09-18 DIAGNOSIS — Z90710 Acquired absence of both cervix and uterus: Secondary | ICD-10-CM

## 2021-09-18 DIAGNOSIS — Z7189 Other specified counseling: Secondary | ICD-10-CM

## 2021-09-18 NOTE — Patient Instructions (Signed)
You are healing great from surgery!  Remember, no heavy lifting until 6 weeks after surgery and nothing in the vagina for at least 8 weeks.  I will see you back for follow-up after you finish your vaginal radiation.  Please do not hesitate to call me if you need anything in the meantime.

## 2021-09-18 NOTE — Progress Notes (Signed)
Gynecologic Oncology Return Clinic Visit  09/18/2021  Reason for Visit: Follow-up after recent surgery, treatment planning  Treatment History: Oncology History Overview Note  MMR IHC normal   Endometrial carcinoma ([place])  08/18/2021 Initial Diagnosis   Endometrial carcinoma ([place])   08/29/2021 Surgery   TRH/BSO, bilateral SLN biopsy  Findings: On EUA, 8 cm mobile uterus.  On intra-abdominal entry, normal upper abdominal survey including liver edge, diaphragm, and stomach.  Evidence of prior gastric bypass.  Clips also noted within the pelvis from bypass surgery.  No ascites.  Normal-appearing small and large bowel.  Normal-appearing fallopian tubes.  Bilateral ovaries with filmy adhesions to the medial leaf of the broad ligament and posterior uterus.  Some findings of what appeared to be endosalpingosis along the posterior uterus.  Significant adhesive disease with almost an unidentifiable plane between the bladder and the cervix with a very long cervix.  No frank tumor involvement of the cervix.  No definite evidence of bladder invasion on cystoscopy after.  Mapping successful to bilateral pelvic basins although sentinel lymph node somewhat difficult to discern on either side.  Lymph node removed where end of green lymphatic chain noted.  This was a right external iliac node and a left obturator node.  The left sentinel lymph node was removed with several surrounding lymph nodes due to its location.  On cystoscopy, dome intact, no suture noted in the bladder, strong bilateral ureteral reflux.  Rectal exam normal.   08/29/2021 Pathology Results   Stage IA grade 2 endometrioid No LVI Neg SLN     Interval History: Patient reports overall doing well.  She denies any significant abdominal or pelvic pain.  She had very scant pink tinge yesterday and a few spots of vaginal bleeding today.  Reports normal bowel and bladder function.  Endorses a good appetite without nausea or emesis.  Past  Medical/Surgical History: Past Medical History:  Diagnosis Date   BMI 39.0-39.9,adult    endometrial ca 07/2021   HLD (hyperlipidemia)    Hypertension    Pneumonia    remote history    Past Surgical History:  Procedure Laterality Date   DILATION AND CURETTAGE OF UTERUS  08/03/2021   EYE SURGERY     LYMPH NODE DISSECTION N/A 08/29/2021   Procedure: POSSIBLE LYMPH NODE DISSECTION;  Surgeon: Lafonda Mosses, MD;  Location: [facility];  Service: Gynecology;  Laterality: N/A;   ROBOTIC ASSISTED BILATERAL SALPINGO OOPHERECTOMY Bilateral 08/29/2021   Procedure: XI ROBOTIC ASSISTED BILATERAL SALPINGO OOPHORECTOMY TOTAL HYSTERECTOMY;  Surgeon: Lafonda Mosses, MD;  Location: [facility];  Service: Gynecology;  Laterality: Bilateral;   SENTINEL NODE BIOPSY N/A 08/29/2021   Procedure: SENTINEL NODE BIOPSY;  Surgeon: Lafonda Mosses, MD;  Location: [facility];  Service: Gynecology;  Laterality: N/A;    Family History  Problem Relation Age of Onset   Uterine cancer Maternal Grandmother    Uterine cancer Paternal Grandmother    Leukemia Maternal Aunt    Colon cancer Neg Hx    Breast cancer Neg Hx    Ovarian cancer Neg Hx    Endometrial cancer Neg Hx    Pancreatic cancer Neg Hx    Prostate cancer Neg Hx     Social History   Socioeconomic History   Marital status: Married    Spouse name: Not on file   Number of children: Not on file   Years of education: Not on file   Highest education level: Not on file  Occupational History   Not  on file  Tobacco Use   Smoking status: Never   Smokeless tobacco: Not on file  Vaping Use   Vaping Use: Never used  Substance and Sexual Activity   Alcohol use: Yes    Comment: glass of wine here and there   Drug use: No   Sexual activity: Yes    Birth control/protection: Post-menopausal  Other Topics Concern   Not on file  Social History Narrative   Not on file   Social Determinants of Health   Financial Resource Strain: Not on file  Food  Insecurity: Not on file  Transportation Needs: Not on file  Physical Activity: Not on file  Stress: Not on file  Social Connections: Not on file    Current Medications:  Current Outpatient Medications:    latanoprost (XALATAN) 0.005 % ophthalmic solution, Place 1 drop into both eyes at bedtime., Disp: , Rfl:    lisinopril (PRINIVIL,ZESTRIL) 10 MG tablet, Take 10 mg by mouth daily., Disp: , Rfl:    Multiple Vitamins-Minerals (MULTIVITAMINS THER. W/MINERALS) TABS, Take 1 tablet by mouth daily., Disp: , Rfl:    pravastatin (PRAVACHOL) 40 MG tablet, Take 40 mg by mouth every morning., Disp: , Rfl:    senna-docusate (SENOKOT-S) 8.6-50 MG tablet, Take 2 tablets by mouth at bedtime. For AFTER surgery, do not take if having diarrhea (Patient not taking: Reported on 09/13/2021), Disp: 30 tablet, Rfl: 0   traMADol (ULTRAM) 50 MG tablet, Take 1 tablet (50 mg total) by mouth every 6 (six) hours as needed for severe pain. For AFTER surgery, do not take and drive (Patient not taking: Reported on 09/13/2021), Disp: 10 tablet, Rfl: 0  Review of Systems: Denies appetite changes, fevers, chills, fatigue, unexplained weight changes. Denies hearing loss, neck lumps or masses, mouth sores, ringing in ears or voice changes. Denies cough or wheezing.  Denies shortness of breath. Denies chest pain or palpitations. Denies leg swelling. Denies abdominal distention, pain, blood in stools, constipation, diarrhea, nausea, vomiting, or early satiety. Denies pain with intercourse, dysuria, frequency, hematuria or incontinence. Denies hot flashes, pelvic pain.   Denies joint pain, back pain or muscle pain/cramps. Denies itching, rash, or wounds. Denies dizziness, headaches, numbness or seizures. Denies swollen lymph nodes or glands, denies easy bruising or bleeding. Denies anxiety, depression, confusion, or decreased concentration.  Physical Exam: BP (!) 156/74 (BP Location: Left Arm, Patient Position: Sitting)     Pulse 80    Temp (!) 97.3 F (36.3 C) (Tympanic)    Resp 16    Ht 5' 8"  (1.727 m)    Wt 270 lb (122.5 kg)    SpO2 100%    BMI 41.05 kg/m  General: Alert, oriented, no acute distress. HEENT: Normocephalic, atraumatic, sclera anicteric. Chest: Unlabored breathing on room air. Abdomen: Obese, soft, nontender.  Normoactive bowel sounds.  No masses or hepatosplenomegaly appreciated.  Well-healed incisions, remaining Dermabond removed. Extremities: Grossly normal range of motion.  Warm, well perfused.  No edema bilaterally. Skin: No rashes or lesions noted. GU: Normal appearing external genitalia without erythema, excoriation, or lesions.  Speculum exam reveals cuff intact, no active bleeding, suture still visible.  Bimanual exam reveals cuff intact, no fluctuance or tenderness with palpation.    Laboratory & Radiologic Studies: None new  Assessment & Plan: Lindsey Wallace is a 71 y.o. woman with Stage 1A grade 2 endometrioid endometrial adenocarcinoma who presents for follow-up after surgery.  Patient is doing well from a postoperative standpoint and meeting milestones.  Discussed continued activity restrictions  as well as postoperative limitations.  She would like to go back to work, which she can do without doing any heavy lifting.  She was given a release to work note today.  We reviewed her pathology report again in detail.  The patient was provided with a paper copy of her pathology results.  She is already met with radiation oncology to discuss adjuvant vaginal brachytherapy.  I will see her for follow-up after she completes treatment.  Per [facility] surveillance recommendations, we will proceed with surveillance visits every 3 months for the first 2 years.  28 minutes of total time was spent for this patient encounter, including preparation, face-to-face counseling with the patient and coordination of care, and documentation of the encounter.  [name], MD  Division of Gynecologic  Oncology  Department of Obstetrics and Gynecology  [facility]

## 2021-09-19 ENCOUNTER — Encounter: Payer: Self-pay | Admitting: *Deleted

## 2021-09-19 ENCOUNTER — Telehealth: Payer: Self-pay | Admitting: *Deleted

## 2021-09-19 NOTE — Telephone Encounter (Signed)
Per patient request updated patient's letter; letter faxed and emailed to patient

## 2021-09-27 ENCOUNTER — Telehealth: Payer: Self-pay | Admitting: *Deleted

## 2021-09-27 NOTE — Telephone Encounter (Signed)
CALLED PATIENT TO INFORM OF NEW HDR [facility], SPOKE WITH PATIENT AND SHE IS AWARE OF THESE APPTS. °

## 2021-10-13 NOTE — Progress Notes (Signed)
Lindsey Wallace is here today for [facility] fitting and treatment. ? ?They did not receive external beam radiation therapy. ? ?Does the patient complain of any of the following: ? ?Pain:Denies ?Abdominal bloating: Denies ?Diarrhea/Constipation: denies ?Nausea/Vomiting: denies ?Vaginal Discharge: denies ?Blood in Urine or Stool: denies ?Urinary Issues (dysuria/incomplete emptying/ incontinence/ increased frequency/urgency/nocturia): nocturia x1 ?Post radiation skin changes: n/a ? ? ?Additional comments if applicable: nothing of note ? ?Vitals:  ? 10/18/21 4643  ?BP: (!) 156/98  ?Pulse: 87  ?Resp: 18  ?Temp: (!) 96.3 ?F (35.7 ?C)  ?TempSrc: Temporal  ?SpO2: 98%  ?Weight: 271 lb (122.9 kg)  ?Height: 5\' 8"  (1.727 m)  ? ? ? ?

## 2021-10-17 ENCOUNTER — Telehealth: Payer: Self-pay | Admitting: *Deleted

## 2021-10-17 NOTE — Telephone Encounter (Signed)
CALLED PATIENT TO REMIND OF NEW [facility] [place] FOR 10-18-21, LVM FOR A RETURN CALL

## 2021-10-17 NOTE — Progress Notes (Signed)
?  Radiation Oncology         [phone] ?________________________________ ? ?Name: Lindsey Wallace MRN: [id number]  ?Date: 10/18/2021  DOB: [birth year] ? ?CC: Kelton Pillar, MD  Lafonda Mosses, MD ? ?HDR BRACHYTHERAPY NOTE ? ?DIAGNOSIS: Endometrial Cancer - FIGO grade 2 endometrioid carcinoma, Stage pT1a, pN0 ?  ?  ?Simple treatment device note: ?Patient had construction of her custom vaginal cylinder. She will be treated with a 3.0 cm diameter segmented cylinder. This conforms to her anatomy without undue discomfort. ? ?Vaginal brachytherapy procedure node: ?The patient was brought to the [place] suite. Identity was confirmed. All relevant records and images related to the planned course of therapy were reviewed. The patient freely provided informed written consent to proceed with treatment after reviewing the details related to the planned course of therapy. The consent form was witnessed and verified by the simulation staff. Then, the patient was set-up in a stable reproducible supine position for radiation therapy. Pelvic exam revealed the vaginal cuff to be intact . The patient's custom vaginal cylinder was placed in the proximal vagina. This was affixed to the CT/MR stabilization plate to prevent slippage. Patient tolerated the placement well. ? ?Verification simulation note:  ?A fiducial marker was placed within the vaginal cylinder. An AP and lateral film was then obtained through the pelvis area. This documented accurate position of the vaginal cylinder for treatment. ? ?HDR BRACHYTHERAPY TREATMENT  ?The remote afterloading device was affixed to the vaginal cylinder by catheter. Patient then proceeded to undergo her first high-dose-rate treatment directed at the proximal vagina. The patient was prescribed a dose of 6.0 gray to be delivered to the mucosal surface. Treatment length was 3.0 cm. Patient was treated with 1 channel using 5 dwell positions. Treatment time was 248.8 seconds. Iridium 192 was  the high-dose-rate source for treatment. The patient tolerated the treatment well. After completion of her therapy, a radiation survey was performed documenting return of the iridium source into the GammaMed safe. ?  ?PLAN: The patient will return next week for her second high-dose-rate treatment. ?________________________________  ?----------------------------------- ? ?Blair Promise, PhD, MD ? ?This document serves as a record of services personally performed by Gery Pray, MD. It was created on his behalf by Roney Mans, a trained medical scribe. The creation of this record is based on the scribe's personal observations and the provider's statements to them. This document has been checked and approved by the attending provider. ?  ? ?

## 2021-10-17 NOTE — Progress Notes (Signed)
?Radiation Oncology         [phone] ?________________________________ ? ?Name: Lindsey Wallace MRN: [id number]  ?Date: 10/18/2021  DOB: 04-03-1951 ? ?Vaginal Brachytherapy Procedure Note ? ?CC: Kelton Pillar, MD Lafonda Mosses, MD ? ?  ICD-10-CM   ?1. Endometrial carcinoma ([place])  C54.1   ?  ? ? ?Diagnosis: Endometrial Cancer - FIGO grade 2 endometrioid carcinoma, Stage pT1a, pN0 ? ? ? ?Narrative: She returns today for vaginal cylinder fitting. Since she was last seen for consultation on 09/13/21, the patient followed up with Dr. Berline Lopes on 09/18/21. During which time, the patient reported feeling well other than some ongoing mild spotting/discharge. The patient will return to Dr. Berline Lopes following RT. Otherwise, no significant interval history since the patient was last seen.  ?Exam at that time showed the vaginal cuff intact. ? ?ALLERGIES: is allergic to penicillins. ? ?Meds: ?Current Outpatient Medications  ?Medication Sig Dispense Refill  ? latanoprost (XALATAN) 0.005 % ophthalmic solution Place 1 drop into both eyes at bedtime.    ? lisinopril (PRINIVIL,ZESTRIL) 10 MG tablet Take 10 mg by mouth daily.    ? Multiple Vitamins-Minerals (MULTIVITAMINS THER. W/MINERALS) TABS Take 1 tablet by mouth daily.    ? pravastatin (PRAVACHOL) 40 MG tablet Take 40 mg by mouth every morning.    ? senna-docusate (SENOKOT-S) 8.6-50 MG tablet Take 2 tablets by mouth at bedtime. For AFTER surgery, do not take if having diarrhea (Patient not taking: Reported on 09/13/2021) 30 tablet 0  ? traMADol (ULTRAM) 50 MG tablet Take 1 tablet (50 mg total) by mouth every 6 (six) hours as needed for severe pain. For AFTER surgery, do not take and drive (Patient not taking: Reported on 09/13/2021) 10 tablet 0  ? ?No current facility-administered medications for this encounter.  ? ? ?Physical Findings: ?The patient is in no acute distress. Patient is alert and oriented.  height is 5\' 8"  (1.727 m) and weight is 271 lb (122.9 kg). Her  temporal temperature is 96.3 ?F (35.7 ?C) (abnormal). Her blood pressure is 156/98 (abnormal) and her pulse is 87. Her respiration is 18 and oxygen saturation is 98%.  ? ?No palpable cervical, supraclavicular or axillary lymphoadenopathy. The heart has a regular rate and rhythm. The lungs are clear to auscultation. Abdomen soft and non-tender. ? ?On pelvic examination the external genitalia were unremarkable. A speculum exam was performed. Vaginal cuff intact, no mucosal lesions. On bimanual exam there were no pelvic masses appreciated.  ? ?Lab Findings: ?Lab Results  ?Component Value Date  ? WBC 6.1 08/28/2021  ? HGB 12.9 08/28/2021  ? HCT 42.8 08/28/2021  ? MCV 72.3 (L) 08/28/2021  ? PLT 214 08/28/2021  ? ? ?Radiographic Findings: ?No results found. ? ?Impression: Endometrial Cancer - FIGO grade 2 endometrioid carcinoma, Stage pT1a, pN0 ? ?Patient was fitted for a vaginal cylinder. The patient will be treated with a 3.0 cm diameter cylinder with a treatment length of 3.0 cm. This distended the vaginal vault without undue discomfort. The patient tolerated the procedure well. ? ?The patient was successfully fitted for a vaginal cylinder.  She has healed well and ready to begin vaginal brachytherapy.  ? ?Plan: The patient will proceed with CT simulation and vaginal brachytherapy today.   ? ?_______________________________ ? ? ?Blair Promise, PhD, MD ? ?This document serves as a record of services personally performed by Gery Pray, MD. It was created on his behalf by Roney Mans, a trained medical scribe. The creation of this record is based  on the scribe's personal observations and the provider's statements to them. This document has been checked and approved by the attending provider. ? ?

## 2021-10-18 ENCOUNTER — Ambulatory Visit
Admission: RE | Admit: 2021-10-18 | Discharge: 2021-10-18 | Disposition: A | Discharge status: Home / Self Care | Payer: Medicare PPO | Source: Ambulatory Visit | Attending: Radiation Oncology | Admitting: Radiation Oncology

## 2021-10-18 ENCOUNTER — Other Ambulatory Visit: Payer: Self-pay

## 2021-10-18 ENCOUNTER — Encounter: Payer: Self-pay | Admitting: Radiation Oncology

## 2021-10-18 VITALS — BP 156/98 | HR 87 | Temp 96.3°F | Resp 18 | Ht 68.0 in | Wt 271.0 lb

## 2021-10-18 DIAGNOSIS — C541 Malignant neoplasm of endometrium: Secondary | ICD-10-CM

## 2021-10-18 DIAGNOSIS — Z510 Encounter for antineoplastic radiation therapy: Secondary | ICD-10-CM | POA: Insufficient documentation

## 2021-10-18 NOTE — Progress Notes (Signed)
See MD note for nursing evaluation. °

## 2021-10-24 ENCOUNTER — Telehealth: Payer: Self-pay | Admitting: *Deleted

## 2021-10-24 NOTE — Telephone Encounter (Signed)
CALLED PATIENT TO REMIND OF [facility]DR [place] 10-25-21 @ 11 AM, SPOKE WITH PATIENT AND SHE IS AWARE OF THIS Lindsey Wallace. ?

## 2021-10-24 NOTE — Progress Notes (Signed)
?  Radiation Oncology         [phone] ?________________________________ ? ?Name: Lindsey Wallace MRN: [id number]  ?Date: 10/25/2021  DOB: Jan 08, 1951 ? ?CC: Kelton Pillar, MD  Lafonda Mosses, MD ? ?HDR BRACHYTHERAPY NOTE ? ?DIAGNOSIS: Endometrial Cancer - FIGO grade 2 endometrioid carcinoma, Stage pT1a, pN0 ?  ?  ?Simple treatment device note: ?Patient had construction of her custom vaginal cylinder. She will be treated with a 3.0 cm diameter segmented cylinder. This conforms to her anatomy without undue discomfort. ? ?Vaginal brachytherapy procedure node: ?The patient was brought to the [place] suite. Identity was confirmed. All relevant records and images related to the planned course of therapy were reviewed. The patient freely provided informed written consent to proceed with treatment after reviewing the details related to the planned course of therapy. The consent form was witnessed and verified by the simulation staff. Then, the patient was set-up in a stable reproducible supine position for radiation therapy. Pelvic exam revealed the vaginal cuff to be intact . The patient's custom vaginal cylinder was placed in the proximal vagina. This was affixed to the CT/MR stabilization plate to prevent slippage. Patient tolerated the placement well. ? ?Verification simulation note:  ?A fiducial marker was placed within the vaginal cylinder. An AP and lateral film was then obtained through the pelvis area. This documented accurate position of the vaginal cylinder for treatment. ? ?HDR BRACHYTHERAPY TREATMENT  ?The remote afterloading device was affixed to the vaginal cylinder by catheter. Patient then proceeded to undergo her second high-dose-rate treatment directed at the proximal vagina. The patient was prescribed a dose of 6.0 gray to be delivered to the mucosal surface. Treatment length was 3.0 cm. Patient was treated with 1 channel using 5 dwell positions. Treatment time was 265.6 seconds. Iridium 192 was  the high-dose-rate source for treatment. The patient tolerated the treatment well. After completion of her therapy, a radiation survey was performed documenting return of the iridium source into the GammaMed safe. ?  ?PLAN: The patient will return next week for her third high-dose-rate treatment. ?________________________________  ?----------------------------------- ? ?Blair Promise, PhD, MD ? ?This document serves as a record of services personally performed by Gery Pray, MD. It was created on his behalf by Roney Mans, a trained medical scribe. The creation of this record is based on the scribe's personal observations and the provider's statements to them. This document has been checked and approved by the attending provider. ?  ? ?

## 2021-10-25 ENCOUNTER — Ambulatory Visit
Admission: RE | Admit: 2021-10-25 | Discharge: 2021-10-25 | Disposition: A | Discharge status: Home / Self Care | Payer: Medicare PPO | Source: Ambulatory Visit | Attending: Radiation Oncology | Admitting: Radiation Oncology

## 2021-10-25 ENCOUNTER — Other Ambulatory Visit: Payer: Self-pay

## 2021-10-25 DIAGNOSIS — Z510 Encounter for antineoplastic radiation therapy: Secondary | ICD-10-CM | POA: Diagnosis not present

## 2021-10-25 DIAGNOSIS — C541 Malignant neoplasm of endometrium: Secondary | ICD-10-CM | POA: Diagnosis not present

## 2021-11-01 ENCOUNTER — Telehealth: Payer: Self-pay | Admitting: *Deleted

## 2021-11-01 ENCOUNTER — Ambulatory Visit: Payer: Medicare PPO | Admitting: Radiation Oncology

## 2021-11-01 NOTE — Progress Notes (Signed)
?  Radiation Oncology         [phone] ?________________________________ ? ?Name: Lindsey Wallace MRN: [id number]  ?Date: 11/02/2021  DOB: August 11, 1951 ? ?CC: Kelton Pillar, MD  Lafonda Mosses, MD ? ?HDR BRACHYTHERAPY NOTE ? ?DIAGNOSIS: Endometrial Cancer - FIGO grade 2 endometrioid carcinoma, Stage pT1a, pN0 ?  ?  ?Simple treatment device note: ?Patient had construction of her custom vaginal cylinder. She will be treated with a 3.0 cm diameter segmented cylinder. This conforms to her anatomy without undue discomfort. ? ?Vaginal brachytherapy procedure node: ?The patient was brought to the [place] suite. Identity was confirmed. All relevant records and images related to the planned course of therapy were reviewed. The patient freely provided informed written consent to proceed with treatment after reviewing the details related to the planned course of therapy. The consent form was witnessed and verified by the simulation staff. Then, the patient was set-up in a stable reproducible supine position for radiation therapy. Pelvic exam revealed the vaginal cuff to be intact . The patient's custom vaginal cylinder was placed in the proximal vagina. This was affixed to the CT/MR stabilization plate to prevent slippage. Patient tolerated the placement well. ? ?Verification simulation note:  ?A fiducial marker was placed within the vaginal cylinder. An AP and lateral film was then obtained through the pelvis area. This documented accurate position of the vaginal cylinder for treatment. ? ?HDR BRACHYTHERAPY TREATMENT  ?The remote afterloading device was affixed to the vaginal cylinder by catheter. Patient then proceeded to undergo her third high-dose-rate treatment directed at the proximal vagina. The patient was prescribed a dose of 6.0 gray to be delivered to the mucosal surface. Treatment length was 3.0 cm. Patient was treated with 1 channel using 5 dwell positions. Treatment time was 286.2 seconds. Iridium 192 was  the high-dose-rate source for treatment. The patient tolerated the treatment well. After completion of her therapy, a radiation survey was performed documenting return of the iridium source into the GammaMed safe. ?  ?PLAN: The patient will return next week for her fourth high-dose-rate treatment. ?________________________________  ?----------------------------------- ? ?Blair Promise, PhD, MD ? ?This document serves as a record of services personally performed by Gery Pray, MD. It was created on his behalf by Roney Mans, a trained medical scribe. The creation of this record is based on the scribe's personal observations and the provider's statements to them. This document has been checked and approved by the attending provider. ?  ? ?

## 2021-11-01 NOTE — Telephone Encounter (Signed)
CALLED PATIENT TO REMIND OF [facility] [place] 11-02-21 @ 11 AM, SPOKE WITH PATIENT AND SHE IS AWARE OF THIS [employer]. ?

## 2021-11-02 ENCOUNTER — Ambulatory Visit: Payer: Medicare PPO | Admitting: Radiation Oncology

## 2021-11-02 ENCOUNTER — Ambulatory Visit
Admission: RE | Admit: 2021-11-02 | Discharge: 2021-11-02 | Disposition: A | Discharge status: Home / Self Care | Payer: Medicare PPO | Source: Ambulatory Visit | Attending: Radiation Oncology | Admitting: Radiation Oncology

## 2021-11-02 ENCOUNTER — Other Ambulatory Visit: Payer: Self-pay

## 2021-11-02 DIAGNOSIS — C541 Malignant neoplasm of endometrium: Secondary | ICD-10-CM | POA: Diagnosis not present

## 2021-11-02 DIAGNOSIS — Z510 Encounter for antineoplastic radiation therapy: Secondary | ICD-10-CM | POA: Diagnosis not present

## 2021-11-07 ENCOUNTER — Telehealth: Payer: Self-pay | Admitting: *Deleted

## 2021-11-07 NOTE — Progress Notes (Signed)
?  Radiation Oncology         [phone] ?________________________________ ? ?Name: Lindsey Wallace MRN: [id number]  ?Date: 11/08/2021  DOB: 1951-08-01 ? ?CC: Kelton Pillar, MD  Lafonda Mosses, MD ? ?HDR BRACHYTHERAPY NOTE ? ?DIAGNOSIS: Endometrial Cancer - FIGO grade 2 endometrioid carcinoma, Stage pT1a, pN0 ?  ?  ?Simple treatment device note: ?Patient had construction of her custom vaginal cylinder. She will be treated with a 3.0 cm diameter segmented cylinder. This conforms to her anatomy without undue discomfort. ? ?Vaginal brachytherapy procedure node: ?The patient was brought to the [place] suite. Identity was confirmed. All relevant records and images related to the planned course of therapy were reviewed. The patient freely provided informed written consent to proceed with treatment after reviewing the details related to the planned course of therapy. The consent form was witnessed and verified by the simulation staff. Then, the patient was set-up in a stable reproducible supine position for radiation therapy. Pelvic exam revealed the vaginal cuff to be intact . The patient's custom vaginal cylinder was placed in the proximal vagina. This was affixed to the CT/MR stabilization plate to prevent slippage. Patient tolerated the placement well. ? ?Verification simulation note:  ?A fiducial marker was placed within the vaginal cylinder. An AP and lateral film was then obtained through the pelvis area. This documented accurate position of the vaginal cylinder for treatment. ? ?HDR BRACHYTHERAPY TREATMENT  ?The remote afterloading device was affixed to the vaginal cylinder by catheter. Patient then proceeded to undergo her fourth high-dose-rate treatment directed at the proximal vagina. The patient was prescribed a dose of 6.0 gray to be delivered to the mucosal surface. Treatment length was 3.0 cm. Patient was treated with 1 channel using 5 dwell positions. Treatment time was 302.9 seconds. Iridium 192  was the high-dose-rate source for treatment. The patient tolerated the treatment well. After completion of her therapy, a radiation survey was performed documenting return of the iridium source into the GammaMed safe. ?  ?PLAN: The patient will return next week for her fifth high-dose-rate treatment. ?________________________________  ?----------------------------------- ? ?Blair Promise, PhD, MD ? ?This document serves as a record of services personally performed by Gery Pray, MD. It was created on his behalf by Roney Mans, a trained medical scribe. The creation of this record is based on the scribe's personal observations and the provider's statements to them. This document has been checked and approved by the attending provider. ?  ? ?

## 2021-11-07 NOTE — Telephone Encounter (Signed)
xxxxx 

## 2021-11-08 ENCOUNTER — Ambulatory Visit
Admission: RE | Admit: 2021-11-08 | Discharge: 2021-11-08 | Disposition: A | Discharge status: Home / Self Care | Payer: Medicare PPO | Source: Ambulatory Visit | Attending: Radiation Oncology | Admitting: Radiation Oncology

## 2021-11-08 ENCOUNTER — Other Ambulatory Visit: Payer: Self-pay

## 2021-11-08 DIAGNOSIS — Z510 Encounter for antineoplastic radiation therapy: Secondary | ICD-10-CM | POA: Diagnosis not present

## 2021-11-08 DIAGNOSIS — C541 Malignant neoplasm of endometrium: Secondary | ICD-10-CM | POA: Diagnosis not present

## 2021-11-14 ENCOUNTER — Telehealth: Payer: Self-pay | Admitting: *Deleted

## 2021-11-14 NOTE — Telephone Encounter (Signed)
CALLED PATIENT TO REMIND OF [facility] [place] 11-15-21 @ 10:30 AM, SPOKE WITH PATIENT AND SHE IS AWARE OF THIS [place]. ?

## 2021-11-14 NOTE — Progress Notes (Signed)
?  Radiation Oncology         [phone] ?________________________________ ? ?Name: Lindsey Wallace MRN: [id number]  ?Date: 11/15/2021  DOB: April 28, 1951 ? ?CC: Kelton Pillar, MD  Lafonda Mosses, MD ? ?HDR BRACHYTHERAPY NOTE ? ?DIAGNOSIS: Endometrial Cancer - FIGO grade 2 endometrioid carcinoma, Stage pT1a, pN0 ?  ?  ?Simple treatment device note: ?Patient had construction of her custom vaginal cylinder. She will be treated with a 3.0 cm diameter segmented cylinder. This conforms to her anatomy without undue discomfort. ? ?Vaginal brachytherapy procedure node: ?The patient was brought to the [place] suite. Identity was confirmed. All relevant records and images related to the planned course of therapy were reviewed. The patient freely provided informed written consent to proceed with treatment after reviewing the details related to the planned course of therapy. The consent form was witnessed and verified by the simulation staff. Then, the patient was set-up in a stable reproducible supine position for radiation therapy. Pelvic exam revealed the vaginal cuff to be intact . The patient's custom vaginal cylinder was placed in the proximal vagina. This was affixed to the CT/MR stabilization plate to prevent slippage. Patient tolerated the placement well. ? ?Verification simulation note:  ?A fiducial marker was placed within the vaginal cylinder. An AP and lateral film was then obtained through the pelvis area. This documented accurate position of the vaginal cylinder for treatment. ? ?HDR BRACHYTHERAPY TREATMENT  ?The remote afterloading device was affixed to the vaginal cylinder by catheter. Patient then proceeded to undergo her fifth high-dose-rate treatment directed at the proximal vagina. The patient was prescribed a dose of 6.0 gray to be delivered to the mucosal surface. Treatment length was 3.0 cm. Patient was treated with 1 channel using 5 dwell positions. Treatment time was 323.5 seconds. Iridium 192 was  the high-dose-rate source for treatment. The patient tolerated the treatment well. After completion of her therapy, a radiation survey was performed documenting return of the iridium source into the Gamma[employer] safe. ?  ?PLAN: The patient has completed her high-dose-rate treatments. She will return in one month for routine follow-up. Overall, she tolerated treatment quite well without any significant side effects. ? ? ?________________________________  ?----------------------------------- ? ?Blair Promise, PhD, MD ? ?This document serves as a record of services personally performed by Gery Pray, MD. It was created on his behalf by Roney Mans, a trained medical scribe. The creation of this record is based on the scribe's personal observations and the provider's statements to them. This document has been checked and approved by the attending provider. ?  ? ?

## 2021-11-15 ENCOUNTER — Encounter: Payer: Self-pay | Admitting: Radiation Oncology

## 2021-11-15 ENCOUNTER — Other Ambulatory Visit: Payer: Self-pay

## 2021-11-15 ENCOUNTER — Ambulatory Visit
Admission: RE | Admit: 2021-11-15 | Discharge: 2021-11-15 | Disposition: A | Discharge status: Home / Self Care | Payer: Medicare PPO | Source: Ambulatory Visit | Attending: Radiation Oncology | Admitting: Radiation Oncology

## 2021-11-15 DIAGNOSIS — C541 Malignant neoplasm of endometrium: Secondary | ICD-10-CM | POA: Diagnosis not present

## 2021-11-15 DIAGNOSIS — Z510 Encounter for antineoplastic radiation therapy: Secondary | ICD-10-CM | POA: Diagnosis not present

## 2021-12-12 ENCOUNTER — Encounter: Payer: Self-pay | Admitting: Radiology

## 2021-12-12 DIAGNOSIS — H401131 Primary open-angle glaucoma, bilateral, mild stage: Secondary | ICD-10-CM | POA: Diagnosis not present

## 2021-12-20 NOTE — Progress Notes (Incomplete)
?  Radiation Oncology         [phone] ?________________________________ ? ?Patient Name: Lindsey Wallace ?MRN: [id number] ?DOB: 1950-10-09 ?Referring Physician: [name] ?Date of Service: 11/15/2021 ?[facility] [place] ? ?                                                      End Of Treatment Note ? ?Diagnoses: C54.1-Malignant neoplasm of endometrium ? ?Cancer Staging: The encounter diagnosis was Endometrial carcinoma ([place]). ?  ?Endometrial Cancer - FIGO grade 2 endometrioid carcinoma, Stage pT1a, pN0 ? ?Intent: Curative ? ?Radiation Treatment Dates: 10/18/2021 through 11/15/2021 ?Site Technique Total Dose (Gy) Dose per Fx (Gy) Completed Fx Beam Energies  ?Vagina: Pelvis HDR-brachy 30/30 6 5/5 Ir-192  ? ?Narrative: Overall, she tolerated treatment quite well without any significant side effects. ? ?Plan: The patient will follow-up with radiation oncology in one month . ? ?________________________________________________ ?----------------------------------- ? ?Blair Promise, PhD, MD ? ?This document serves as a record of services personally performed by Gery Pray, MD. It was created on his behalf by Roney Mans, a trained medical scribe. The creation of this record is based on the scribe's personal observations and the provider's statements to them. This document has been checked and approved by the attending provider. ? ?

## 2021-12-20 NOTE — Progress Notes (Signed)
?Radiation Oncology         [phone] ?________________________________ ? ?Name: Lindsey Wallace MRN: [id number]  ?Date: 12/21/2021  DOB: [birth year] ? ?Follow-Up Visit Note ? ?CC: Kelton Pillar, MD  Lafonda Mosses, MD ? ?  ICD-10-CM   ?1. Endometrial carcinoma ([place])  C54.1   ?  ? ? ?Diagnosis: The encounter diagnosis was Endometrial carcinoma ([place]). ?  ?Endometrial Cancer - FIGO grade 2 endometrioid carcinoma, Stage pT1a, pN0 ? ?Interval Since Last Radiation: 1 month and 5 days  ? ?Intent: Curative ? ?Radiation Treatment Dates: 10/18/2021 through 11/15/2021 ?Site Technique Total Dose (Gy) Dose per Fx (Gy) Completed Fx Beam Energies  ?Vagina: Pelvis HDR-brachy 30/30 6 5/5 Ir-192  ? ? ?Narrative:  The patient returns today for routine follow-up. Overall, she tolerated treatment quite well without any significant side effects.        ? ?Since she was last seen for consultation on 09/13/21, the patient followed up with Dr. Berline Lopes on 09/18/21. During which time, the patient reported feeling well other than some ongoing mild spotting/discharge, and pelvic exam showed the vaginal cuff as intact. The patient will return to Dr. Berline Lopes following RT.  ? ?Otherwise, no significant interval history since the patient was last seen.   ? ?She reports tolerating her vaginal brachytherapy well without any side effects.  She denies any residual effects from her brachytherapy at this time.  She reports no abdominal bloating or vaginal bleeding urination symptoms or bowel problems.                     ? ?Allergies:  is allergic to penicillins. ? ?Meds: ?Current Outpatient Medications  ?Medication Sig Dispense Refill  ? latanoprost (XALATAN) 0.005 % ophthalmic solution Place 1 drop into both eyes at bedtime.    ? lisinopril (PRINIVIL,ZESTRIL) 10 MG tablet Take 10 mg by mouth daily.    ? Multiple Vitamins-Minerals (MULTIVITAMINS THER. W/MINERALS) TABS Take 1 tablet by mouth daily.    ? pravastatin (PRAVACHOL) 40 MG tablet Take  40 mg by mouth every morning.    ? senna-docusate (SENOKOT-S) 8.6-50 MG tablet Take 2 tablets by mouth at bedtime. For AFTER surgery, do not take if having diarrhea (Patient not taking: Reported on 09/13/2021) 30 tablet 0  ? traMADol (ULTRAM) 50 MG tablet Take 1 tablet (50 mg total) by mouth every 6 (six) hours as needed for severe pain. For AFTER surgery, do not take and drive (Patient not taking: Reported on 09/13/2021) 10 tablet 0  ? ?No current facility-administered medications for this encounter.  ? ? ?Physical Findings: ?The patient is in no acute distress. Patient is alert and oriented. ? height is '5\' 8"'$  (1.727 m) and weight is 269 lb 4 oz (122.1 kg). Her temporal temperature is 96.8 ?F (36 ?C) (abnormal). Her blood pressure is 135/72 and her pulse is 71. Her respiration is 18 and oxygen saturation is 100%. .   Lungs are clear to auscultation bilaterally. Heart has regular rate and rhythm. No palpable cervical, supraclavicular, or axillary adenopathy. Abdomen soft, non-tender, normal bowel sounds.  Pelvic exam not performed in light of recent completion of treatment. ? ? ?Lab Findings: ?Lab Results  ?Component Value Date  ? WBC 6.1 08/28/2021  ? HGB 12.9 08/28/2021  ? HCT 42.8 08/28/2021  ? MCV 72.3 (L) 08/28/2021  ? PLT 214 08/28/2021  ? ? ?Radiographic Findings: ?No results found. ? ?Impression: The encounter diagnosis was Endometrial carcinoma ([place]). ?  ?Endometrial Cancer - FIGO grade  2 endometrioid carcinoma, Stage pT1a, pN0 ? ?She tolerated her vaginal brachytherapy extremely well without any side effects. ? ?Plan: Today the patient was given a vaginal dilator and instructions on its use.  She will see Dr. Berline Lopes in 2 months.  Routine follow-up in radiation oncology in 5 months. ? ? ?15 minutes of total time was spent for this patient encounter, including preparation, face-to-face counseling with the patient and coordination of care, physical exam, and documentation of the  encounter. ?____________________________________ ? ?Blair Promise, PhD, MD ? ?This document serves as a record of services personally performed by Gery Pray, MD. It was created on his behalf by Roney Mans, a trained medical scribe. The creation of this record is based on the scribe's personal observations and the provider's statements to them. This document has been checked and approved by the attending provider. ? ?

## 2021-12-21 ENCOUNTER — Telehealth: Payer: Self-pay | Admitting: *Deleted

## 2021-12-21 ENCOUNTER — Ambulatory Visit
Admission: RE | Admit: 2021-12-21 | Discharge: 2021-12-21 | Disposition: A | Discharge status: Home / Self Care | Payer: Medicare PPO | Source: Ambulatory Visit | Attending: Radiation Oncology | Admitting: Radiation Oncology

## 2021-12-21 ENCOUNTER — Encounter: Payer: Self-pay | Admitting: Radiation Oncology

## 2021-12-21 ENCOUNTER — Other Ambulatory Visit: Payer: Self-pay

## 2021-12-21 ENCOUNTER — Telehealth: Payer: Self-pay | Admitting: Radiation Oncology

## 2021-12-21 DIAGNOSIS — Z923 Personal history of irradiation: Secondary | ICD-10-CM | POA: Diagnosis not present

## 2021-12-21 DIAGNOSIS — C541 Malignant neoplasm of endometrium: Secondary | ICD-10-CM | POA: Insufficient documentation

## 2021-12-21 DIAGNOSIS — Z79899 Other long term (current) drug therapy: Secondary | ICD-10-CM | POA: Diagnosis not present

## 2021-12-21 NOTE — Progress Notes (Signed)
[name] is here today for follow up post radiation to the pelvic. ? ?They completed their radiation on: 11/15/21 ? ?Does the patient complain of any of the following: ? ?Pain:denies ?Abdominal bloating: denies ?Diarrhea/Constipation: denies ?Nausea/Vomiting: denies ?Vaginal Discharge: denies ?Blood in Urine or Stool: denies ?Urinary Issues (dysuria/incomplete emptying/ incontinence/ increased frequency/urgency): frequency, nocturia x1 ?Does patient report using vaginal dilator 2-3 times a week and/or sexually active 2-3 weeks: vaginal dilators and education provided ?Post radiation skin changes: denies ? ? ?Additional comments if applicable: nothing of note ?  ?Vitals:  ? 12/21/21 1052  ?BP: 135/72  ?Pulse: 71  ?Resp: 18  ?Temp: (!) 96.8 ?F (36 ?C)  ?TempSrc: Temporal  ?SpO2: 100%  ?Weight: 269 lb 4 oz (122.1 kg)  ?Height: '5\' 8"'$  (1.727 m)  ? ? ?

## 2021-12-21 NOTE — Patient Instructions (Signed)

## 2021-12-21 NOTE — Telephone Encounter (Signed)
Scheduled the patient for a follow up appt on 7/10 ?

## 2021-12-21 NOTE — Telephone Encounter (Signed)
Called patient to inform of her 05/24/22 5 month follow up with Dr. Sondra Come. Patient voiced understanding. Placed appointment reminder in mail. ?

## 2022-02-19 ENCOUNTER — Ambulatory Visit: Payer: Medicare PPO | Admitting: Gynecologic Oncology

## 2022-02-22 ENCOUNTER — Encounter: Payer: Self-pay | Admitting: Gynecologic Oncology

## 2022-02-25 NOTE — Patient Instructions (Addendum)
It was good to see you today. I do not see or feel any evidence of cancer on your exam.   We will alternate visits between my office and Dr. Sondra Come every 3 months. You see Dr. Sondra Come in October. Please call my office in December to get a visit scheduled to see me in January 2024.   If you develop any new and concerning symptoms (such as vaginal bleeding, pelvic pain, change to bowel function or unintentional weight loss), please call to see me sooner.

## 2022-02-25 NOTE — Progress Notes (Unsigned)
Gynecologic Oncology Return Clinic Visit  02/25/22  Reason for Visit: surveillance visit in the setting of uterine cancer  Treatment History: Oncology History Overview Note  MMR IHC normal MSS   Endometrial carcinoma ([place])  08/18/2021 Initial Diagnosis   Endometrial carcinoma ([place])   08/29/2021 Surgery   TRH/BSO, bilateral SLN biopsy  Findings: On EUA, 8 cm mobile uterus.  On intra-abdominal entry, normal upper abdominal survey including liver edge, diaphragm, and stomach.  Evidence of prior gastric bypass.  Clips also noted within the pelvis from bypass surgery.  No ascites.  Normal-appearing small and large bowel.  Normal-appearing fallopian tubes.  Bilateral ovaries with filmy adhesions to the medial leaf of the broad ligament and posterior uterus.  Some findings of what appeared to be endosalpingosis along the posterior uterus.  Significant adhesive disease with almost an unidentifiable plane between the bladder and the cervix with a very long cervix.  No frank tumor involvement of the cervix.  No definite evidence of bladder invasion on cystoscopy after.  Mapping successful to bilateral pelvic basins although sentinel lymph node somewhat difficult to discern on either side.  Lymph node removed where end of green lymphatic chain noted.  This was a right external iliac node and a left obturator node.  The left sentinel lymph node was removed with several surrounding lymph nodes due to its location.  On cystoscopy, dome intact, no suture noted in the bladder, strong bilateral ureteral reflux.  Rectal exam normal.   08/29/2021 Pathology Results   Stage IA grade 2 endometrioid No LVI Neg SLN   10/18/2021 - 11/15/2021 Radiation Therapy   10/18/2021 through 11/15/2021 Site Technique Total Dose (Gy) Dose per Fx (Gy) Completed Fx Beam Energies  Vagina: Pelvis HDR-brachy 30/30 6 5/5 Ir-192        Interval History: Completed VBT in late March.  Reports doing well.  Denies any vaginal bleeding or  discharge.  Reports baseline bowel and bladder function.  Denies any abdominal or pelvic pain.  Ports good appetite, denies nausea or emesis.  Trying to remember to use her vaginal dilator regularly.  Past Medical/Surgical History: Past Medical History:  Diagnosis Date   BMI 39.0-39.9,adult    endometrial ca 07/2021   History of radiation therapy    endometrial - HDR brachy [facility] 10/18/2021-11/15/2021  Dr Gery Pray   HLD (hyperlipidemia)    Hypertension    Pneumonia    remote history    Past Surgical History:  Procedure Laterality Date   DILATION AND CURETTAGE OF UTERUS  08/03/2021   EYE SURGERY     LYMPH NODE DISSECTION N/A 08/29/2021   Procedure: POSSIBLE LYMPH NODE DISSECTION;  Surgeon: Lafonda Mosses, MD;  Location: [facility];  Service: Gynecology;  Laterality: N/A;   ROBOTIC ASSISTED BILATERAL SALPINGO OOPHERECTOMY Bilateral 08/29/2021   Procedure: XI ROBOTIC ASSISTED BILATERAL SALPINGO OOPHORECTOMY TOTAL HYSTERECTOMY;  Surgeon: Lafonda Mosses, MD;  Location: [facility];  Service: Gynecology;  Laterality: Bilateral;   SENTINEL NODE BIOPSY N/A 08/29/2021   Procedure: SENTINEL NODE BIOPSY;  Surgeon: Lafonda Mosses, MD;  Location: [facility];  Service: Gynecology;  Laterality: N/A;    Family History  Problem Relation Age of Onset   Uterine cancer Maternal Grandmother    Uterine cancer Paternal Grandmother    Leukemia Maternal Aunt    Colon cancer Neg Hx    Breast cancer Neg Hx    Ovarian cancer Neg Hx    Endometrial cancer Neg Hx    Pancreatic cancer Neg Hx  Prostate cancer Neg Hx     Social History   Socioeconomic History   Marital status: Married    Spouse name: Not on file   Number of children: Not on file   Years of education: Not on file   Highest education level: Not on file  Occupational History   Not on file  Tobacco Use   Smoking status: Never   Smokeless tobacco: Not on file  Vaping Use   Vaping Use: Never used  Substance and Sexual Activity    Alcohol use: Yes    Comment: glass of wine here and there   Drug use: No   Sexual activity: Yes    Birth control/protection: Post-menopausal  Other Topics Concern   Not on file  Social History Narrative   Not on file   Social Determinants of Health   Financial Resource Strain: Not on file  Food Insecurity: Not on file  Transportation Needs: Not on file  Physical Activity: Not on file  Stress: Not on file  Social Connections: Not on file    Current Medications:  Current Outpatient Medications:    latanoprost (XALATAN) 0.005 % ophthalmic solution, Place 1 drop into both eyes at bedtime., Disp: , Rfl:    lisinopril (PRINIVIL,ZESTRIL) 10 MG tablet, Take 10 mg by mouth daily., Disp: , Rfl:    Multiple Vitamins-Minerals (MULTI FOR HER 50+) TABS, 1 tablet, Disp: , Rfl:    pravastatin (PRAVACHOL) 40 MG tablet, Take 40 mg by mouth every morning., Disp: , Rfl:   Review of Systems: Denies appetite changes, fevers, chills, fatigue, unexplained weight changes. Denies hearing loss, neck lumps or masses, mouth sores, ringing in ears or voice changes. Denies cough or wheezing.  Denies shortness of breath. Denies chest pain or palpitations. Denies leg swelling. Denies abdominal distention, pain, blood in stools, constipation, diarrhea, nausea, vomiting, or early satiety. Denies pain with intercourse, dysuria, frequency, hematuria or incontinence. Denies hot flashes, pelvic pain, vaginal bleeding or vaginal discharge.   Denies joint pain, back pain or muscle pain/cramps. Denies itching, rash, or wounds. Denies dizziness, headaches, numbness or seizures. Denies swollen lymph nodes or glands, denies easy bruising or bleeding. Denies anxiety, depression, confusion, or decreased concentration.  Physical Exam: BP (!) 145/72 (BP Location: Left Arm, Patient Position: Sitting)   Pulse 70   Temp 98.2 F (36.8 C) (Oral)   Resp 16   Ht 5' 8"  (1.727 m)   Wt 267 lb 14.4 oz (121.5 kg)   SpO2 99%    BMI 40.73 kg/m  General: Alert, oriented, no acute distress. HEENT: Normocephalic, atraumatic, sclera anicteric. Chest: Clear to auscultation bilaterally.  No wheezes or rhonchi. Cardiovascular: Regular rate and rhythm, no murmurs. Abdomen: Obese, soft, nontender.  Normoactive bowel sounds.  No masses or hepatosplenomegaly appreciated.  Well-healed incisions. Extremities: Grossly normal range of motion.  Warm, well perfused.  No edema bilaterally. Skin: No rashes or lesions noted. Lymphatics: No cervical, supraclavicular, or inguinal adenopathy. GU: Normal appearing external genitalia without erythema, excoriation, or lesions.  Speculum exam reveals mildly atrophic vaginal mucosa with radiation changes present, no lesions noted.  Bimanual exam reveals cuff is intact, smooth, no nodularity or masses.  Rectovaginal exam confirms these findings.  Laboratory & Radiologic Studies: None new  Assessment & Plan: Lindsey Wallace is a 71 y.o. woman with Stage 1A grade 2 endometrioid endometrial adenocarcinoma s/p surgery in 08/2021. Completed VBT in 10/2021. MMR intact, MSS.   Patient is doing well. She is NED on exam. She was encouraged  to continue using her vaginal dilator regularly.    Per NCCN surveillance recommendations, we will proceed with surveillance visits every 3 months for the first 2 years. She will see Dr. Sondra Come in 3 months and return for a visit here in 6 months. We reviewed signs and symptoms that would be concerning for disease recurrence, patient knows to call if she develops any of these.    20 minutes of total time was spent for this patient encounter, including preparation, face-to-face counseling with the patient and coordination of care, and documentation of the encounter.  [name], MD  Division of Gynecologic Oncology  Department of Obstetrics and Gynecology  [facility]

## 2022-02-26 ENCOUNTER — Encounter: Payer: Self-pay | Admitting: Gynecologic Oncology

## 2022-02-26 ENCOUNTER — Inpatient Hospital Stay: Payer: Medicare PPO | Attending: Gynecologic Oncology | Admitting: Gynecologic Oncology

## 2022-02-26 ENCOUNTER — Other Ambulatory Visit: Payer: Self-pay

## 2022-02-26 VITALS — BP 145/72 | HR 70 | Temp 98.2°F | Resp 16 | Ht 68.0 in | Wt 267.9 lb

## 2022-02-26 DIAGNOSIS — Z90722 Acquired absence of ovaries, bilateral: Secondary | ICD-10-CM | POA: Diagnosis not present

## 2022-02-26 DIAGNOSIS — Z923 Personal history of irradiation: Secondary | ICD-10-CM | POA: Diagnosis not present

## 2022-02-26 DIAGNOSIS — C541 Malignant neoplasm of endometrium: Secondary | ICD-10-CM | POA: Diagnosis not present

## 2022-02-26 DIAGNOSIS — Z90710 Acquired absence of both cervix and uterus: Secondary | ICD-10-CM | POA: Insufficient documentation

## 2022-03-20 DIAGNOSIS — E785 Hyperlipidemia, unspecified: Secondary | ICD-10-CM | POA: Diagnosis not present

## 2022-03-20 DIAGNOSIS — Z1331 Encounter for screening for depression: Secondary | ICD-10-CM | POA: Diagnosis not present

## 2022-03-20 DIAGNOSIS — Z0000 Encounter for general adult medical examination without abnormal findings: Secondary | ICD-10-CM | POA: Diagnosis not present

## 2022-03-20 DIAGNOSIS — I10 Essential (primary) hypertension: Secondary | ICD-10-CM | POA: Diagnosis not present

## 2022-03-20 DIAGNOSIS — C541 Malignant neoplasm of endometrium: Secondary | ICD-10-CM | POA: Diagnosis not present

## 2022-03-20 DIAGNOSIS — E7439 Other disorders of intestinal carbohydrate absorption: Secondary | ICD-10-CM | POA: Diagnosis not present

## 2022-03-20 DIAGNOSIS — G4700 Insomnia, unspecified: Secondary | ICD-10-CM | POA: Diagnosis not present

## 2022-03-26 DIAGNOSIS — H401131 Primary open-angle glaucoma, bilateral, mild stage: Secondary | ICD-10-CM | POA: Diagnosis not present

## 2022-04-13 DIAGNOSIS — K644 Residual hemorrhoidal skin tags: Secondary | ICD-10-CM | POA: Diagnosis not present

## 2022-04-13 DIAGNOSIS — D124 Benign neoplasm of descending colon: Secondary | ICD-10-CM | POA: Diagnosis not present

## 2022-04-13 DIAGNOSIS — K648 Other hemorrhoids: Secondary | ICD-10-CM | POA: Diagnosis not present

## 2022-04-13 DIAGNOSIS — D123 Benign neoplasm of transverse colon: Secondary | ICD-10-CM | POA: Diagnosis not present

## 2022-04-13 DIAGNOSIS — Z86010 Personal history of colonic polyps: Secondary | ICD-10-CM | POA: Diagnosis not present

## 2022-04-13 DIAGNOSIS — Z09 Encounter for follow-up examination after completed treatment for conditions other than malignant neoplasm: Secondary | ICD-10-CM | POA: Diagnosis not present

## 2022-04-19 DIAGNOSIS — D124 Benign neoplasm of descending colon: Secondary | ICD-10-CM | POA: Diagnosis not present

## 2022-04-19 DIAGNOSIS — D123 Benign neoplasm of transverse colon: Secondary | ICD-10-CM | POA: Diagnosis not present

## 2022-04-26 ENCOUNTER — Ambulatory Visit: Payer: Self-pay | Admitting: Radiation Oncology

## 2022-05-23 NOTE — Progress Notes (Signed)
  Radiation Oncology         ([phone]) [phone] ________________________________  Name: Lindsey Wallace MRN: [id number]  Date: 05/24/2022  DOB: 1950/12/08  Follow-Up Visit Note  CC: Kelton Pillar, MD  Lafonda Mosses, MD  No diagnosis found.  Diagnosis: The encounter diagnosis was Endometrial carcinoma ([place]).   Endometrial Cancer - FIGO grade 2 endometrioid carcinoma, Stage pT1a, pN0  Interval Since Last Radiation: 6 months and 6 days   Intent: Curative  Radiation Treatment Dates: 10/18/2021 through 11/15/2021 Site Technique Total Dose (Gy) Dose per Fx (Gy) Completed Fx Beam Energies  Vagina: Pelvis HDR-brachy 30/30 6 5/5 Ir-192    Narrative:  The patient returns today for routine 5 month follow-up. She was last seen here for follow-up on 12/21/21. Since her last visit, the patient followed up with Dr. Berline Lopes on 02/26/22. During which time, the patient denied any symptoms concerning for disease recurrence and was noted as NED on examination. She was also encouraged to use her vaginal dilator regularly given that she reported infrequent use.   Otherwise, no significant interval history since the patient was last seen.   ***                              Allergies:  is allergic to penicillins.  Meds: Current Outpatient Medications  Medication Sig Dispense Refill   latanoprost (XALATAN) 0.005 % ophthalmic solution Place 1 drop into both eyes at bedtime.     lisinopril (PRINIVIL,ZESTRIL) 10 MG tablet Take 10 mg by mouth daily.     Multiple Vitamins-Minerals (MULTI FOR HER 50+) TABS 1 tablet     pravastatin (PRAVACHOL) 40 MG tablet Take 40 mg by mouth every morning.     No current facility-administered medications for this encounter.    Physical Findings: The patient is in no acute distress. Patient is alert and oriented.  vitals were not taken for this visit. .  No significant changes. Lungs are clear to auscultation bilaterally. Heart has regular rate and rhythm. No  palpable cervical, supraclavicular, or axillary adenopathy. Abdomen soft, non-tender, normal bowel sounds.  On pelvic examination the external genitalia were unremarkable. A speculum exam was performed. There are no mucosal lesions noted in the vaginal vault. A Pap smear was obtained of the proximal vagina. On bimanual and rectovaginal examination there were no pelvic masses appreciated. ***  Lab Findings: Lab Results  Component Value Date   WBC 6.1 08/28/2021   HGB 12.9 08/28/2021   HCT 42.8 08/28/2021   MCV 72.3 (L) 08/28/2021   PLT 214 08/28/2021    Radiographic Findings: No results found.  Impression:  The encounter diagnosis was Endometrial carcinoma ([place]).   Endometrial Cancer - FIGO grade 2 endometrioid carcinoma, Stage pT1a, pN0  The patient is recovering from the effects of radiation.  ***  Plan:  ***   *** minutes of total time was spent for this patient encounter, including preparation, face-to-face counseling with the patient and coordination of care, physical exam, and documentation of the encounter. ____________________________________  Blair Promise, PhD, MD  This document serves as a record of services personally performed by Gery Pray, MD. It was created on his behalf by Roney Mans, a trained medical scribe. The creation of this record is based on the scribe's personal observations and the provider's statements to them. This document has been checked and approved by the attending provider.

## 2022-05-24 ENCOUNTER — Ambulatory Visit
Admission: RE | Admit: 2022-05-24 | Discharge: 2022-05-24 | Disposition: A | Discharge status: Home / Self Care | Payer: Medicare PPO | Source: Ambulatory Visit | Attending: Radiation Oncology | Admitting: Radiation Oncology

## 2022-05-24 VITALS — BP 148/57 | HR 87 | Temp 96.9°F | Resp 18 | Ht 68.0 in | Wt 268.0 lb

## 2022-05-24 DIAGNOSIS — Z8542 Personal history of malignant neoplasm of other parts of uterus: Secondary | ICD-10-CM | POA: Diagnosis not present

## 2022-05-24 DIAGNOSIS — Z923 Personal history of irradiation: Secondary | ICD-10-CM | POA: Insufficient documentation

## 2022-05-24 DIAGNOSIS — C541 Malignant neoplasm of endometrium: Secondary | ICD-10-CM | POA: Diagnosis not present

## 2022-05-24 DIAGNOSIS — Z79899 Other long term (current) drug therapy: Secondary | ICD-10-CM | POA: Diagnosis not present

## 2022-05-24 NOTE — Progress Notes (Signed)
Nimra Acker-Rothwell is here today for follow up post radiation to the pelvic.  They completed their radiation on: 11/15/2021   Does the patient complain of any of the following:  Pain:no Abdominal bloating: no Diarrhea/Constipation: no Nausea/Vomiting: no Vaginal Discharge: no Blood in Urine or Stool: no Urinary Issues (dysuria/incomplete emptying/ incontinence/ increased frequency/urgency): no Does patient report using vaginal dilator 2-3 times a week and/or sexually active 2-3 weeks: yes at least 1-2 times a week Post radiation skin changes: no   Additional comments if applicable: None noted.  BP (!) 148/57 (BP Location: Left Arm, Patient Position: Sitting)   Pulse 87   Temp (!) 96.9 F (36.1 C) (Temporal)   Resp 18   Ht '5\' 8"'$  (1.727 m)   Wt 268 lb (121.6 kg)   SpO2 100%   BMI 40.75 kg/m

## 2022-05-26 DIAGNOSIS — Z1231 Encounter for screening mammogram for malignant neoplasm of breast: Secondary | ICD-10-CM | POA: Diagnosis not present

## 2022-07-05 ENCOUNTER — Telehealth: Payer: Self-pay | Admitting: *Deleted

## 2022-07-05 NOTE — Telephone Encounter (Signed)
Spoke with the patient to schedule a follow up appt with Dr Berline Lopes in January. Patient in the dental office and will call back later today to schedule

## 2022-07-25 ENCOUNTER — Telehealth: Payer: Self-pay | Admitting: *Deleted

## 2022-07-25 ENCOUNTER — Encounter: Payer: Self-pay | Admitting: Gynecologic Oncology

## 2022-07-25 NOTE — Telephone Encounter (Signed)
Patient scheduled for a follow up appt

## 2022-07-26 ENCOUNTER — Telehealth: Payer: Self-pay | Admitting: *Deleted

## 2022-07-26 NOTE — Telephone Encounter (Signed)
error 

## 2022-07-26 NOTE — Telephone Encounter (Signed)
Per Dr Berline Lopes need to move her appt from 12/15 to 1/11, called and left the patient a message to call the office back

## 2022-07-27 ENCOUNTER — Inpatient Hospital Stay: Payer: Medicare PPO | Admitting: Gynecologic Oncology

## 2022-07-27 DIAGNOSIS — C541 Malignant neoplasm of endometrium: Secondary | ICD-10-CM

## 2022-07-27 NOTE — Telephone Encounter (Signed)
Spoke with the patient and moved the appt from today to 1/11

## 2022-08-14 DIAGNOSIS — H524 Presbyopia: Secondary | ICD-10-CM | POA: Diagnosis not present

## 2022-08-14 DIAGNOSIS — H401131 Primary open-angle glaucoma, bilateral, mild stage: Secondary | ICD-10-CM | POA: Diagnosis not present

## 2022-08-14 DIAGNOSIS — H5213 Myopia, bilateral: Secondary | ICD-10-CM | POA: Diagnosis not present

## 2022-08-14 DIAGNOSIS — H2513 Age-related nuclear cataract, bilateral: Secondary | ICD-10-CM | POA: Diagnosis not present

## 2022-08-29 ENCOUNTER — Encounter: Payer: Self-pay | Admitting: Gynecologic Oncology

## 2022-08-30 ENCOUNTER — Inpatient Hospital Stay: Payer: Medicare PPO | Attending: Gynecologic Oncology | Admitting: Gynecologic Oncology

## 2022-08-30 ENCOUNTER — Encounter: Payer: Self-pay | Admitting: Gynecologic Oncology

## 2022-08-30 ENCOUNTER — Other Ambulatory Visit: Payer: Self-pay

## 2022-08-30 VITALS — BP 138/49 | HR 95 | Temp 97.7°F | Resp 18 | Ht 68.0 in | Wt 258.0 lb

## 2022-08-30 DIAGNOSIS — Z8542 Personal history of malignant neoplasm of other parts of uterus: Secondary | ICD-10-CM | POA: Diagnosis present

## 2022-08-30 DIAGNOSIS — Z90710 Acquired absence of both cervix and uterus: Secondary | ICD-10-CM | POA: Insufficient documentation

## 2022-08-30 DIAGNOSIS — Z08 Encounter for follow-up examination after completed treatment for malignant neoplasm: Secondary | ICD-10-CM | POA: Diagnosis present

## 2022-08-30 DIAGNOSIS — C541 Malignant neoplasm of endometrium: Secondary | ICD-10-CM

## 2022-08-30 DIAGNOSIS — Z923 Personal history of irradiation: Secondary | ICD-10-CM | POA: Insufficient documentation

## 2022-08-30 DIAGNOSIS — Z90722 Acquired absence of ovaries, bilateral: Secondary | ICD-10-CM | POA: Diagnosis not present

## 2022-08-30 NOTE — Progress Notes (Signed)
Gynecologic Oncology Return Clinic Visit  08/30/22  Reason for Visit: surveillance visit in the setting of uterine cancer   Treatment History: Oncology History Overview Note  MMR IHC normal MSS   Endometrial carcinoma ([place])  08/18/2021 Initial Diagnosis   Endometrial carcinoma ([place])   08/29/2021 Surgery   TRH/BSO, bilateral SLN biopsy  Findings: On EUA, 8 cm mobile uterus.  On intra-abdominal entry, normal upper abdominal survey including liver edge, diaphragm, and stomach.  Evidence of prior gastric bypass.  Clips also noted within the pelvis from bypass surgery.  No ascites.  Normal-appearing small and large bowel.  Normal-appearing fallopian tubes.  Bilateral ovaries with filmy adhesions to the medial leaf of the broad ligament and posterior uterus.  Some findings of what appeared to be endosalpingosis along the posterior uterus.  Significant adhesive disease with almost an unidentifiable plane between the bladder and the cervix with a very long cervix.  No frank tumor involvement of the cervix.  No definite evidence of bladder invasion on cystoscopy after.  Mapping successful to bilateral pelvic basins although sentinel lymph node somewhat difficult to discern on either side.  Lymph node removed where end of green lymphatic chain noted.  This was a right external iliac node and a left obturator node.  The left sentinel lymph node was removed with several surrounding lymph nodes due to its location.  On cystoscopy, dome intact, no suture noted in the bladder, strong bilateral ureteral reflux.  Rectal exam normal.   08/29/2021 Pathology Results   Stage IA grade 2 endometrioid No LVI Neg SLN   10/18/2021 - 11/15/2021 Radiation Therapy   10/18/2021 through 11/15/2021 Site Technique Total Dose (Gy) Dose per Fx (Gy) Completed Fx Beam Energies  Vagina: Pelvis HDR-brachy 30/30 6 5/5 Ir-192        Interval History: Saw Dr. Sondra Come in October 2023.   Doing well.  Trying to use her dilator as  frequently as she is able to.  Denies any vaginal bleeding or discharge.  Denies any pelvic pain.  Reports baseline bowel bladder function.  Past Medical/Surgical History: Past Medical History:  Diagnosis Date   BMI 39.0-39.9,adult    endometrial ca 07/2021   History of radiation therapy    endometrial - HDR brachy VCC 10/18/2021-11/15/2021  Dr Gery Pray   HLD (hyperlipidemia)    Hypertension    Pneumonia    remote history    Past Surgical History:  Procedure Laterality Date   DILATION AND CURETTAGE OF UTERUS  08/03/2021   EYE SURGERY     LYMPH NODE DISSECTION N/A 08/29/2021   Procedure: POSSIBLE LYMPH NODE DISSECTION;  Surgeon: Lafonda Mosses, MD;  Location: [facility];  Service: Gynecology;  Laterality: N/A;   ROBOTIC ASSISTED BILATERAL SALPINGO OOPHERECTOMY Bilateral 08/29/2021   Procedure: XI ROBOTIC ASSISTED BILATERAL SALPINGO OOPHORECTOMY TOTAL HYSTERECTOMY;  Surgeon: Lafonda Mosses, MD;  Location: [facility];  Service: Gynecology;  Laterality: Bilateral;   SENTINEL NODE BIOPSY N/A 08/29/2021   Procedure: SENTINEL NODE BIOPSY;  Surgeon: Lafonda Mosses, MD;  Location: [facility];  Service: Gynecology;  Laterality: N/A;    Family History  Problem Relation Age of Onset   Uterine cancer Maternal Grandmother    Uterine cancer Paternal Grandmother    Leukemia Maternal Aunt    Colon cancer Neg Hx    Breast cancer Neg Hx    Ovarian cancer Neg Hx    Endometrial cancer Neg Hx    Pancreatic cancer Neg Hx    Prostate cancer Neg  Hx     Social History   Socioeconomic History   Marital status: Married    Spouse name: Not on file   Number of children: Not on file   Years of education: Not on file   Highest education level: Not on file  Occupational History   Not on file  Tobacco Use   Smoking status: Never   Smokeless tobacco: Not on file  Vaping Use   Vaping Use: Never used  Substance and Sexual Activity   Alcohol use: Yes    Comment: glass of wine here and there    Drug use: No   Sexual activity: Yes    Birth control/protection: Post-menopausal  Other Topics Concern   Not on file  Social History Narrative   Not on file   Social Determinants of Health   Financial Resource Strain: Not on file  Food Insecurity: Not on file  Transportation Needs: Not on file  Physical Activity: Not on file  Stress: Not on file  Social Connections: Not on file    Current Medications:  Current Outpatient Medications:    latanoprost (XALATAN) 0.005 % ophthalmic solution, Place 1 drop into both eyes at bedtime., Disp: , Rfl:    lisinopril (PRINIVIL,ZESTRIL) 10 MG tablet, Take 10 mg by mouth daily., Disp: , Rfl:    Multiple Vitamins-Minerals (MULTI FOR HER 50+) TABS, 1 tablet, Disp: , Rfl:    pravastatin (PRAVACHOL) 40 MG tablet, Take 40 mg by mouth every morning., Disp: , Rfl:   Review of Systems: Denies appetite changes, fevers, chills, fatigue, unexplained weight changes. Denies hearing loss, neck lumps or masses, mouth sores, ringing in ears or voice changes. Denies cough or wheezing.  Denies shortness of breath. Denies chest pain or palpitations. Denies leg swelling. Denies abdominal distention, pain, blood in stools, constipation, diarrhea, nausea, vomiting, or early satiety. Denies pain with intercourse, dysuria, frequency, hematuria or incontinence. Denies hot flashes, pelvic pain, vaginal bleeding or vaginal discharge.   Denies joint pain, back pain or muscle pain/cramps. Denies itching, rash, or wounds. Denies dizziness, headaches, numbness or seizures. Denies swollen lymph nodes or glands, denies easy bruising or bleeding. Denies anxiety, depression, confusion, or decreased concentration.  Physical Exam: BP (!) 138/49 (BP Location: Left Arm, Patient Position: Sitting)   Pulse 95   Temp 97.7 F (36.5 C) (Oral)   Resp 18   Ht '5\' 8"'$  (1.727 m)   Wt 258 lb (117 kg)   SpO2 97%   BMI 39.23 kg/m  General: Alert, oriented, no acute distress. HEENT:  Normocephalic, atraumatic, sclera anicteric. Chest: Clear to auscultation bilaterally.  No wheezes or rhonchi. Cardiovascular: Regular rate and rhythm, no murmurs. Abdomen: Obese, soft, nontender.  Normoactive bowel sounds.  No masses or hepatosplenomegaly appreciated.  Well-healed incisions. Extremities: Grossly normal range of motion.  Warm, well perfused.  No edema bilaterally. Skin: No rashes or lesions noted. Lymphatics: No cervical, supraclavicular, or inguinal adenopathy. GU: Normal appearing external genitalia without erythema, excoriation, or lesions.  Speculum exam reveals mildly atrophic vaginal mucosa with radiation changes present, no lesions noted.  Bimanual exam reveals cuff is intact, smooth, no nodularity or masses.  Rectovaginal exam confirms these findings.  Laboratory & Radiologic Studies: None new  Assessment & Plan: Lindsey Wallace is a 72 y.o. woman with Stage 1A grade 2 (Stage IA2 by 2023 staging) endometrioid endometrial adenocarcinoma s/p surgery in 08/2021. Completed VBT in 10/2021. MMR intact, MSS.   Patient is doing well. NED on exam. Encouraged to continue using her  vaginal dilator regularly.    Per NCCN surveillance recommendations, we will proceed with surveillance visits every 3 months for the first 2 years. She will see Dr. Sondra Come in 3 months and return for a visit here in 6 months. We reviewed signs and symptoms that would be concerning for disease recurrence, patient knows to call if she develops any of these.    20 minutes of total time was spent for this patient encounter, including preparation, face-to-face counseling with the patient and coordination of care, and documentation of the encounter.  [name], MD  Division of Gynecologic Oncology  Department of Obstetrics and Gynecology  [facility]

## 2022-08-30 NOTE — Patient Instructions (Signed)
It was good to see you today.  I do not see or feel any evidence of cancer recurrence on your exam.  I will see you for follow-up in 6 months.  As always, if you develop any new and concerning symptoms before your next visit, please call to see me sooner.   

## 2022-11-21 NOTE — Progress Notes (Signed)
Radiation Oncology         [phone] ________________________________  Name: Lindsey Wallace MRN: [id number]  Date: 11/22/2022  DOB: 07-13-1951  Follow-Up Visit Note  CC: Maurice Small, MD  Carver Fila, MD    ICD-10-CM   1. Endometrial carcinoma [C54.1]  C54.1       Diagnosis: The encounter diagnosis was Endometrial carcinoma (HCC).   Endometrial Cancer - FIGO grade 2 endometrioid carcinoma, Stage pT1a, pN0  Interval Since Last Radiation: 1 year and 6 days   Intent: Curative  Radiation Treatment Dates: 10/18/2021 through 11/15/2021 Site Technique Total Dose (Gy) Dose per Fx (Gy) Completed Fx Beam Energies  Vagina: Pelvis HDR-brachy 30/30 6 5/5 Ir-192   Narrative:  The patient returns today for routine 6 month follow-up. She was last seen here for follow-up on 05/24/22. Since her last visit, the patient followed up with Dr. Pricilla Holm on 08/30/22. During which time, the patient denied any symptoms concerning for disease recurrence, and she was noted as NED on examination.    No other significant interval history since the patient was last seen for follow-up.   On evaluation today the patient any problems with abdominal bloating.  She denies any vaginal bleeding or discharge.  She continues to use her vaginal dilator.  She denies any urinary symptoms or bowel complaints.                          Allergies:  is allergic to penicillins.  Meds: Current Outpatient Medications  Medication Sig Dispense Refill   latanoprost (XALATAN) 0.005 % ophthalmic solution Place 1 drop into both eyes at bedtime.     lisinopril (PRINIVIL,ZESTRIL) 10 MG tablet Take 10 mg by mouth daily.     Multiple Vitamins-Minerals (MULTI FOR HER 50+) TABS 1 tablet     pravastatin (PRAVACHOL) 40 MG tablet Take 40 mg by mouth every morning.     No current facility-administered medications for this encounter.    Physical Findings: The patient is in no acute distress. Patient is alert and  oriented.  height is 5\' 8"  (1.727 m). Her temporal temperature is 96.9 F (36.1 C) (abnormal). Her blood pressure is 145/64 (abnormal) and her pulse is 85. Her respiration is 18 and oxygen saturation is 100%. .  Lungs are clear to auscultation bilaterally. Heart has regular rate and rhythm. No palpable cervical, supraclavicular, or axillary adenopathy. Abdomen soft, non-tender, normal bowel sounds.  On pelvic examination the external genitalia were unremarkable. A speculum exam was performed. There are no mucosal lesions noted in the vaginal vault.  Mild radiation changes noted. On bimanual and rectovaginal examination there were no pelvic masses appreciated.     Lab Findings: Lab Results  Component Value Date   WBC 6.1 08/28/2021   HGB 12.9 08/28/2021   HCT 42.8 08/28/2021   MCV 72.3 (L) 08/28/2021   PLT 214 08/28/2021    Radiographic Findings: No results found.  Impression:  The encounter diagnosis was Endometrial carcinoma (HCC).   Endometrial Cancer - FIGO grade 2 endometrioid carcinoma, Stage pT1a, pN0  No evidence of recurrence on clinical exam today.  She does not appear to be exhibiting any aftereffects of her surgery and vaginal brachytherapy.  Plan: She will meet with Dr. Pricilla Holm in late June.  Routine follow-up in radiation oncology in late September.   25 minutes of total time was spent for this patient encounter, including preparation, face-to-face counseling with the patient and coordination of care, physical  exam, and documentation of the encounter. ____________________________________  Billie Lade, PhD, MD  This document serves as a record of services personally performed by Antony Blackbird, MD. It was created on his behalf by Neena Rhymes, a trained medical scribe. The creation of this record is based on the scribe's personal observations and the provider's statements to them. This document has been checked and approved by the attending provider.

## 2022-11-22 ENCOUNTER — Ambulatory Visit
Admission: RE | Admit: 2022-11-22 | Discharge: 2022-11-22 | Disposition: A | Discharge status: Home / Self Care | Payer: Medicare PPO | Source: Ambulatory Visit | Attending: Radiation Oncology | Admitting: Radiation Oncology

## 2022-11-22 ENCOUNTER — Encounter: Payer: Self-pay | Admitting: Radiation Oncology

## 2022-11-22 VITALS — BP 145/64 | HR 85 | Temp 96.9°F | Resp 18 | Ht 68.0 in

## 2022-11-22 DIAGNOSIS — Z8542 Personal history of malignant neoplasm of other parts of uterus: Secondary | ICD-10-CM | POA: Insufficient documentation

## 2022-11-22 DIAGNOSIS — Z923 Personal history of irradiation: Secondary | ICD-10-CM | POA: Diagnosis not present

## 2022-11-22 DIAGNOSIS — C541 Malignant neoplasm of endometrium: Secondary | ICD-10-CM

## 2022-11-22 DIAGNOSIS — Z79899 Other long term (current) drug therapy: Secondary | ICD-10-CM | POA: Diagnosis not present

## 2022-11-22 NOTE — Progress Notes (Signed)
Lindsey Wallace is here today for follow up post radiation to the pelvic.  They completed their radiation on: 11/15/21   Does the patient complain of any of the following:  Pain: No Abdominal bloating: No Diarrhea/Constipation: No Nausea/Vomiting: No Vaginal Discharge: No Blood in Urine or Stool: No Urinary Issues (dysuria/incomplete emptying/ incontinence/ increased frequency/urgency): No Does patient report using vaginal dilator 2-3 times a week and/or sexually active 2-3 weeks: Yes, both.  Post radiation skin changes: No   Additional comments if applicable:  BP (!) 0000000 (BP Location: Left Arm, Patient Position: Sitting)   Pulse 85   Temp (!) 96.9 F (36.1 C) (Temporal)   Resp 18   Ht 5\' 8"  (1.727 m)   SpO2 100%   BMI 39.23 kg/m

## 2022-12-16 IMAGING — CT CT CHEST-ABD-PELV W/ CM
3 of 5 series · 15 of 36 positions shown, 17 images · IV contrast (OMNIPAQUE)
Comparison: None.

CLINICAL DATA: Endometrial cancer.  Staging.

EXAM:
CT CHEST, ABDOMEN, AND PELVIS WITH CONTRAST
TECHNIQUE: Multidetector CT imaging of the chest, abdomen and pelvis was
performed following the standard protocol during bolus
administration of intravenous contrast.
CONTRAST:  80mL OMNIPAQUE IOHEXOL 350 MG/ML SOLN

[Series 2: cap with · axial · 0.75mm/px · z∈[-594,-84]mm · 10 of 126 slices shown, 12 images]
[im 12/126  mediastinal]
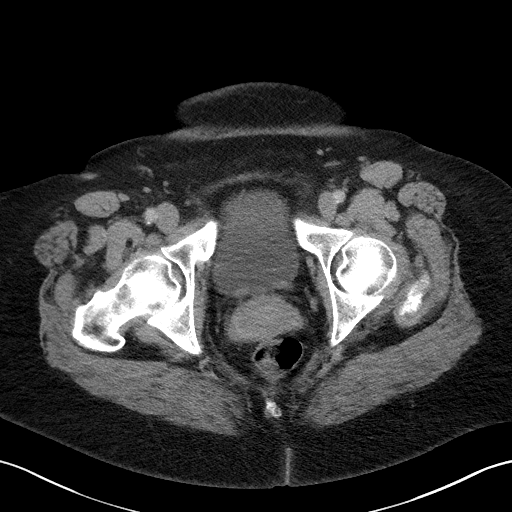
[im 12/126  bone]
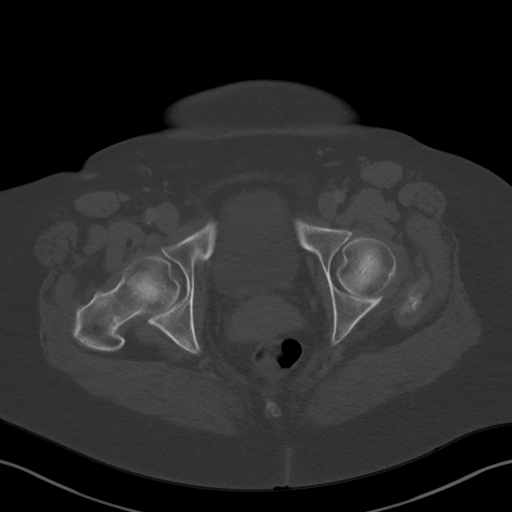
[im 23/126  mediastinal]
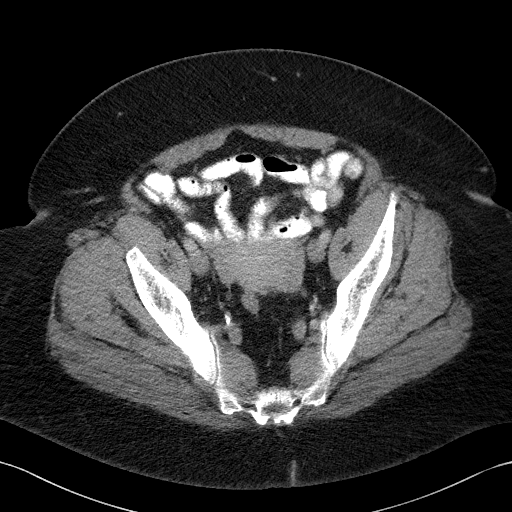
[im 35/126  mediastinal]
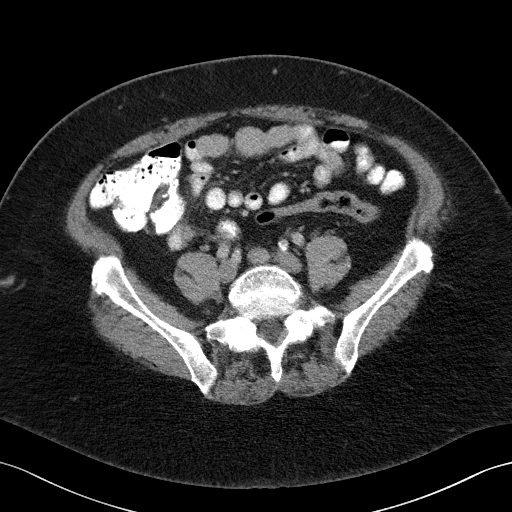
[im 46/126  mediastinal]
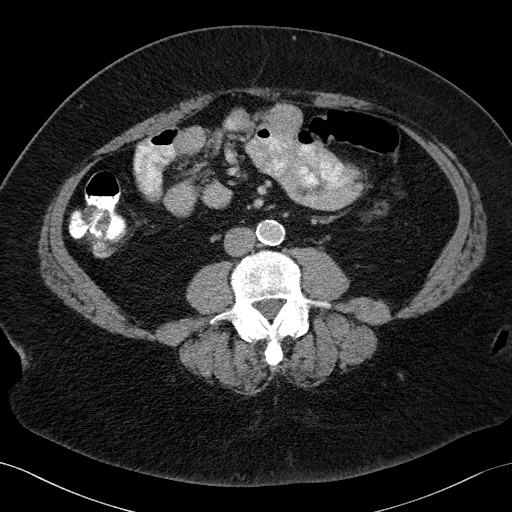
[im 57/126  mediastinal]
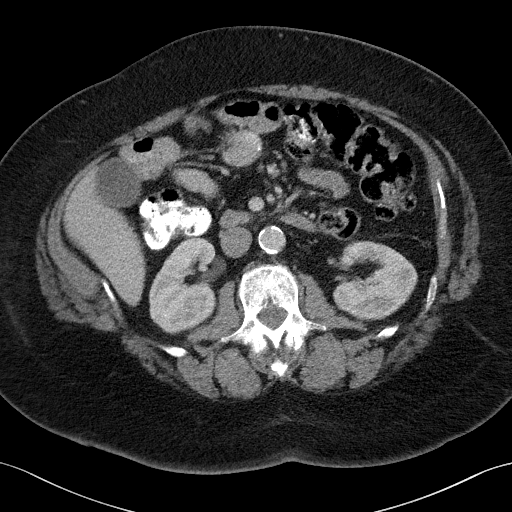
[im 69/126  mediastinal]
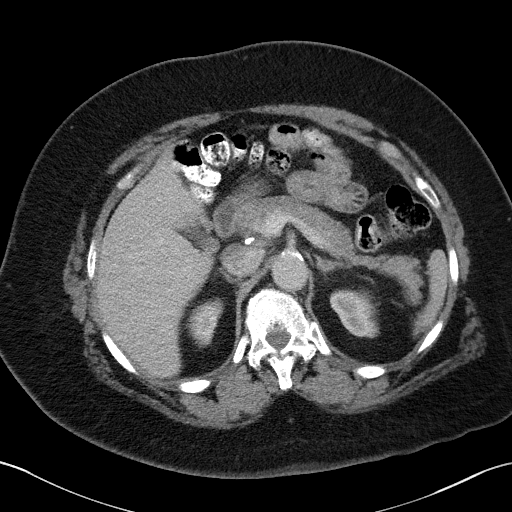
[im 80/126  mediastinal]
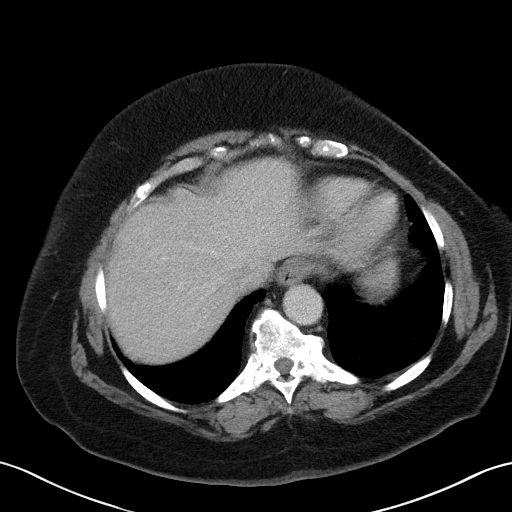
[im 91/126  mediastinal]
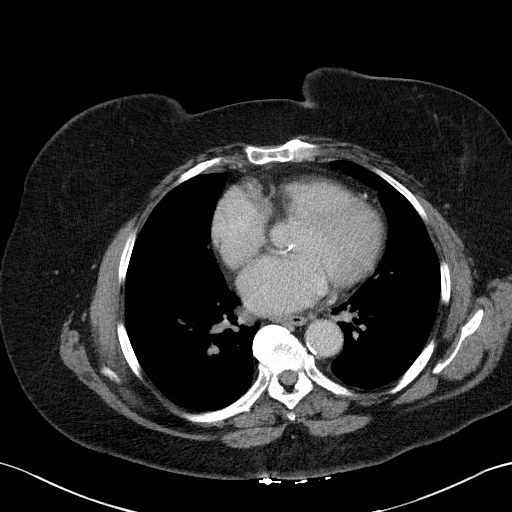
[im 103/126  mediastinal]
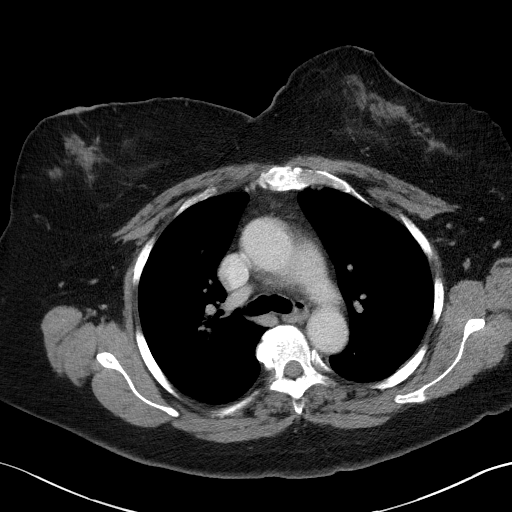
[im 103/126  bone]
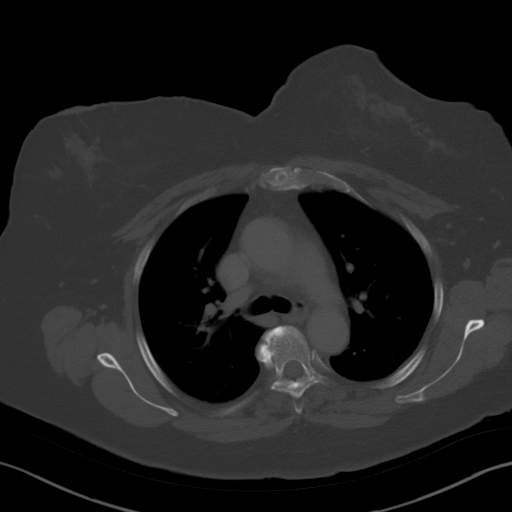
[im 114/126  mediastinal]
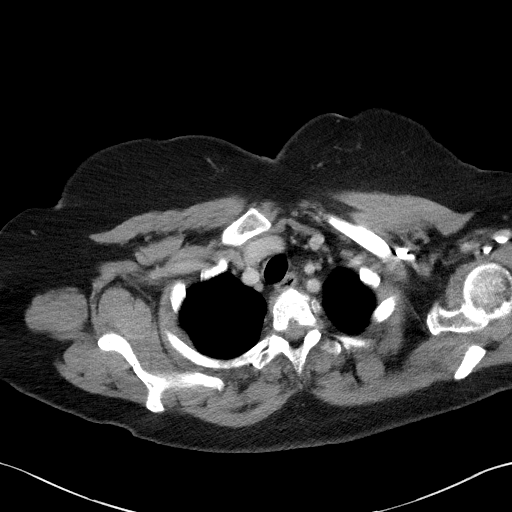

[Series 4: coronals · coronal · 0.86mm/px · 3 of 143 slices shown]
[im 29/143  mediastinal]
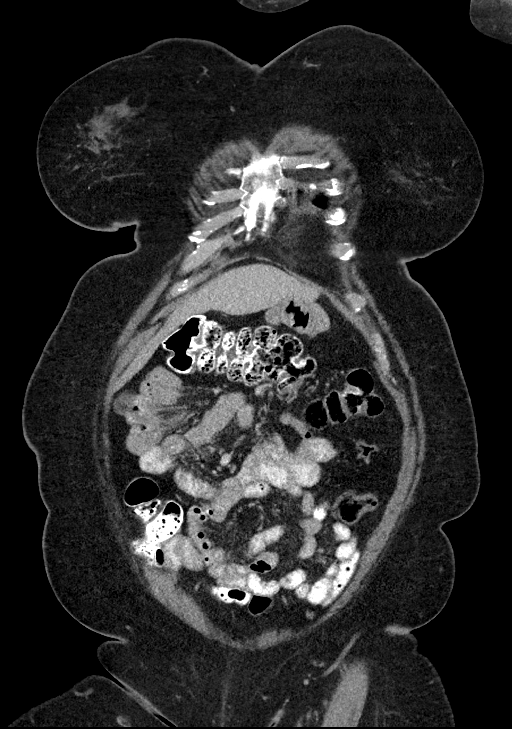
[im 57/143  mediastinal]
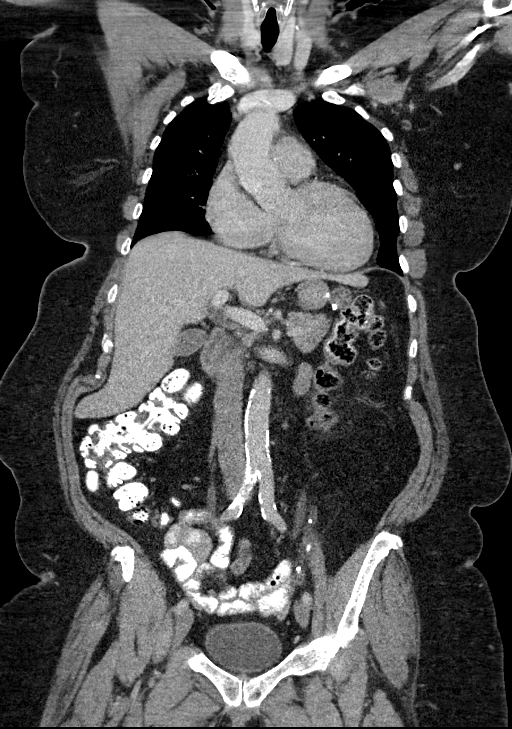
[im 86/143  mediastinal]
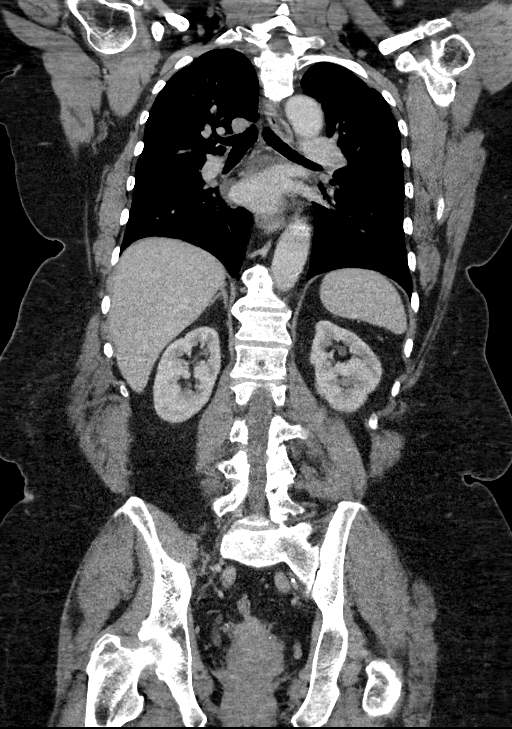

[Series 6: lung · axial · 0.75mm/px · z∈[-276,-230]mm · 2 of 138 slices shown]
[im 12/138  bone]
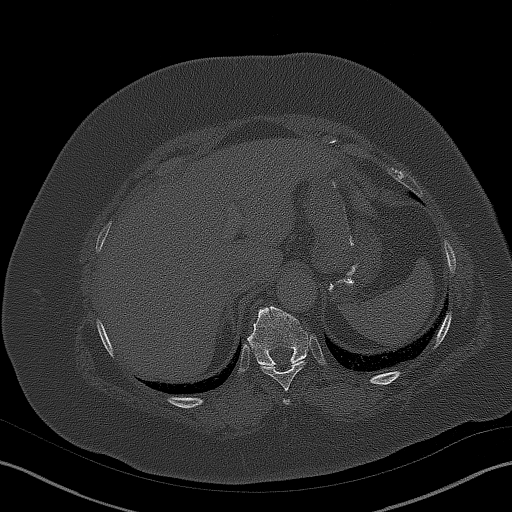
[im 35/138  bone]
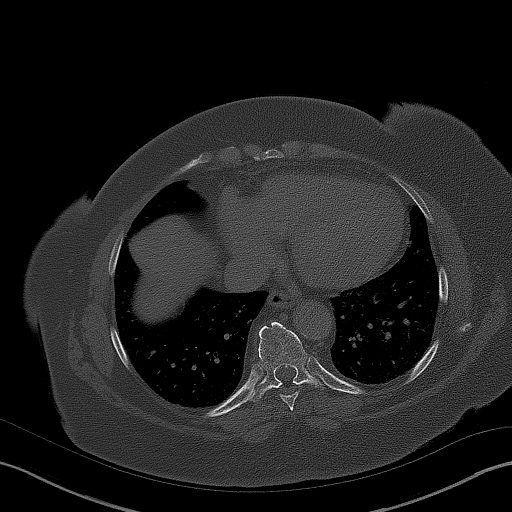

[15 of 36 positions shown; findings below may reference images not displayed]

FINDINGS: CT CHEST FINDINGS

Cardiovascular: Atherosclerosis of the aorta, great vessels and
coronary arteries. No acute vascular findings are demonstrated. The
heart size is normal. There is no pericardial effusion.

Mediastinum/Nodes: There are no enlarged mediastinal, hilar or
axillary lymph nodes. The thyroid gland, trachea and esophagus
demonstrate no significant findings.

Lungs/Pleura: No pleural effusion or pneumothorax. There is a 4 mm
right lower lobe nodule on image 89/6. Additional tiny right lung
nodules are noted on images 50 and 95. No highly suspicious
pulmonary nodules.

Musculoskeletal/Chest wall: No chest wall mass or suspicious osseous
findings.

CT ABDOMEN AND PELVIS FINDINGS

Hepatobiliary: The liver is normal in density without suspicious
focal abnormality. Probable small dependent gallstones in the
gallbladder. No evidence of gallbladder wall thickening, surrounding
inflammation or biliary dilatation.

Pancreas: Unremarkable. No pancreatic ductal dilatation or
surrounding inflammatory changes.

Spleen: Normal in size without focal abnormality.

Adrenals/Urinary Tract: Both adrenal glands appear normal. Probable
mild renal cortical scarring bilaterally. Tiny nonobstructing right
renal calculi. No evidence of ureteral calculus, hydronephrosis or
renal mass. The bladder appears unremarkable.

Stomach/Bowel: Enteric contrast was administered and has passed to
the level of the mid colon. Evidence of previous gastric bypass
procedure. No evidence of bowel wall thickening, distention or
surrounding inflammation. The appendix appears normal.

Vascular/Lymphatic: There are no enlarged abdominal or pelvic lymph
nodes. Moderate aortic and branch vessel atherosclerosis without
acute vascular findings.

Reproductive: The uterus and cervix appear unremarkable. No evidence
of endometrial thickening or parametrial tumor by CT. No adnexal
mass.

Other: No evidence of abdominal wall mass or hernia. No ascites.

Musculoskeletal: No acute or significant osseous findings. Mild
thoracolumbar spondylosis.
IMPRESSION: 1. No evidence of metastatic endometrial carcinoma. Specifically, no
abdominopelvic adenopathy identified.
2. Tiny right lung nodules are nonspecific and not highly suspect.
Fleischner criteria do not apply. Suggest attention on follow-up.
3. The uterus and adnexa appear unremarkable by CT.
4. Status post gastric bypass. Cholelithiasis and small
nonobstructing right renal calculi.
5.  Aortic Atherosclerosis ([id number]).

## 2022-12-26 DIAGNOSIS — H401131 Primary open-angle glaucoma, bilateral, mild stage: Secondary | ICD-10-CM | POA: Diagnosis not present

## 2023-02-14 ENCOUNTER — Encounter: Payer: Self-pay | Admitting: Gynecologic Oncology

## 2023-02-15 ENCOUNTER — Other Ambulatory Visit: Payer: Self-pay

## 2023-02-15 ENCOUNTER — Encounter: Payer: Self-pay | Admitting: Gynecologic Oncology

## 2023-02-15 ENCOUNTER — Inpatient Hospital Stay: Payer: Medicare PPO | Attending: Gynecologic Oncology | Admitting: Gynecologic Oncology

## 2023-02-15 VITALS — BP 149/58 | HR 88 | Temp 98.0°F | Ht 68.9 in | Wt 264.2 lb

## 2023-02-15 DIAGNOSIS — Z923 Personal history of irradiation: Secondary | ICD-10-CM | POA: Insufficient documentation

## 2023-02-15 DIAGNOSIS — Z9079 Acquired absence of other genital organ(s): Secondary | ICD-10-CM | POA: Diagnosis not present

## 2023-02-15 DIAGNOSIS — Z08 Encounter for follow-up examination after completed treatment for malignant neoplasm: Secondary | ICD-10-CM

## 2023-02-15 DIAGNOSIS — Z90722 Acquired absence of ovaries, bilateral: Secondary | ICD-10-CM | POA: Diagnosis not present

## 2023-02-15 DIAGNOSIS — Z8542 Personal history of malignant neoplasm of other parts of uterus: Secondary | ICD-10-CM | POA: Diagnosis not present

## 2023-02-15 DIAGNOSIS — Z90710 Acquired absence of both cervix and uterus: Secondary | ICD-10-CM | POA: Insufficient documentation

## 2023-02-15 DIAGNOSIS — C541 Malignant neoplasm of endometrium: Secondary | ICD-10-CM

## 2023-02-15 NOTE — Progress Notes (Signed)
Gynecologic Oncology Return Clinic Visit  02/15/23  Reason for Visit: surveillance visit in the setting of uterine cancer   Treatment History: Oncology History Overview Note  MMR IHC normal MSS   Endometrial carcinoma (HCC)  08/18/2021 Initial Diagnosis   Endometrial carcinoma (HCC)   08/29/2021 Surgery   TRH/BSO, bilateral SLN biopsy  Findings: On EUA, 8 cm mobile uterus.  On intra-abdominal entry, normal upper abdominal survey including liver edge, diaphragm, and stomach.  Evidence of prior gastric bypass.  Clips also noted within the pelvis from bypass surgery.  No ascites.  Normal-appearing small and large bowel.  Normal-appearing fallopian tubes.  Bilateral ovaries with filmy adhesions to the medial leaf of the broad ligament and posterior uterus.  Some findings of what appeared to be endosalpingosis along the posterior uterus.  Significant adhesive disease with almost an unidentifiable plane between the bladder and the cervix with a very long cervix.  No frank tumor involvement of the cervix.  No definite evidence of bladder invasion on cystoscopy after.  Mapping successful to bilateral pelvic basins although sentinel lymph node somewhat difficult to discern on either side.  Lymph node removed where end of green lymphatic chain noted.  This was a right external iliac node and a left obturator node.  The left sentinel lymph node was removed with several surrounding lymph nodes due to its location.  On cystoscopy, dome intact, no suture noted in the bladder, strong bilateral ureteral reflux.  Rectal exam normal.   08/29/2021 Pathology Results   Stage IA grade 2 endometrioid No LVI Neg SLN   10/18/2021 - 11/15/2021 Radiation Therapy   10/18/2021 through 11/15/2021 Site Technique Total Dose (Gy) Dose per Fx (Gy) Completed Fx Beam Energies  Vagina: Pelvis HDR-brachy 30/30 6 5/5 Ir-192        Interval History: Doing well.  Denies vaginal bleeding or discharge.  Continues to use her dilator  once or twice a week.  Endorses normal bowel and bladder function.  Looking forward to celebrating her birthday with a trip to a casino in [place].  Past Medical/Surgical History: Past Medical History:  Diagnosis Date   BMI 39.0-39.9,adult    endometrial ca 07/2021   History of radiation therapy    endometrial - HDR brachy VCC 10/18/2021-11/15/2021  Dr Antony Blackbird   HLD (hyperlipidemia)    Hypertension    Pneumonia    remote history    Past Surgical History:  Procedure Laterality Date   DILATION AND CURETTAGE OF UTERUS  08/03/2021   EYE SURGERY     LYMPH NODE DISSECTION N/A 08/29/2021   Procedure: POSSIBLE LYMPH NODE DISSECTION;  Surgeon: Carver Fila, MD;  Location: [facility];  Service: Gynecology;  Laterality: N/A;   ROBOTIC ASSISTED BILATERAL SALPINGO OOPHERECTOMY Bilateral 08/29/2021   Procedure: XI ROBOTIC ASSISTED BILATERAL SALPINGO OOPHORECTOMY TOTAL HYSTERECTOMY;  Surgeon: Carver Fila, MD;  Location: [facility];  Service: Gynecology;  Laterality: Bilateral;   SENTINEL NODE BIOPSY N/A 08/29/2021   Procedure: SENTINEL NODE BIOPSY;  Surgeon: Carver Fila, MD;  Location: [facility];  Service: Gynecology;  Laterality: N/A;    Family History  Problem Relation Age of Onset   Uterine cancer Maternal Grandmother    Uterine cancer Paternal Grandmother    Leukemia Maternal Aunt    Colon cancer Neg Hx    Breast cancer Neg Hx    Ovarian cancer Neg Hx    Endometrial cancer Neg Hx    Pancreatic cancer Neg Hx    Prostate cancer Neg  Hx     Social History   Socioeconomic History   Marital status: Married    Spouse name: Not on file   Number of children: Not on file   Years of education: Not on file   Highest education level: Not on file  Occupational History   Not on file  Tobacco Use   Smoking status: Never   Smokeless tobacco: Not on file  Vaping Use   Vaping Use: Never used  Substance and Sexual Activity   Alcohol use: Yes    Comment: glass of wine here and  there   Drug use: No   Sexual activity: Yes    Birth control/protection: Post-menopausal  Other Topics Concern   Not on file  Social History Narrative   Not on file   Social Determinants of Health   Financial Resource Strain: Not on file  Food Insecurity: Not on file  Transportation Needs: Not on file  Physical Activity: Not on file  Stress: Not on file  Social Connections: Not on file    Current Medications:  Current Outpatient Medications:    latanoprost (XALATAN) 0.005 % ophthalmic solution, Place 1 drop into both eyes at bedtime., Disp: , Rfl:    lisinopril (PRINIVIL,ZESTRIL) 10 MG tablet, Take 10 mg by mouth daily., Disp: , Rfl:    Multiple Vitamins-Minerals (MULTI FOR HER 50+) TABS, 1 tablet, Disp: , Rfl:    pravastatin (PRAVACHOL) 40 MG tablet, Take 40 mg by mouth every morning., Disp: , Rfl:   Review of Systems: Denies appetite changes, fevers, chills, fatigue, unexplained weight changes. Denies hearing loss, neck lumps or masses, mouth sores, ringing in ears or voice changes. Denies cough or wheezing.  Denies shortness of breath. Denies chest pain or palpitations. Denies leg swelling. Denies abdominal distention, pain, blood in stools, constipation, diarrhea, nausea, vomiting, or early satiety. Denies pain with intercourse, dysuria, frequency, hematuria or incontinence. Denies hot flashes, pelvic pain, vaginal bleeding or vaginal discharge.   Denies joint pain, back pain or muscle pain/cramps. Denies itching, rash, or wounds. Denies dizziness, headaches, numbness or seizures. Denies swollen lymph nodes or glands, denies easy bruising or bleeding. Denies anxiety, depression, confusion, or decreased concentration.  Physical Exam: BP (!) 149/58 (BP Location: Right Wrist, Patient Position: Sitting)   Pulse 88   Temp 98 F (36.7 C)   Ht 5' 8.9" (1.75 m)   Wt 264 lb 3.2 oz (119.8 kg)   SpO2 99%   BMI 39.13 kg/m  General: Alert, oriented, no acute  distress. HEENT: Normocephalic, atraumatic, sclera anicteric. Chest: Clear to auscultation bilaterally.  No wheezes or rhonchi. Cardiovascular: Regular rate and rhythm, no murmurs. Abdomen: Obese, soft, nontender.  Normoactive bowel sounds.  No masses or hepatosplenomegaly appreciated.  Well-healed incisions. Extremities: Grossly normal range of motion.  Warm, well perfused.  No edema bilaterally. Skin: No rashes or lesions noted. Lymphatics: No cervical, supraclavicular, or inguinal adenopathy. GU: Normal appearing external genitalia without erythema, excoriation, or lesions.  Speculum exam reveals mildly atrophic vaginal mucosa with radiation changes present, no lesions noted.  Bimanual exam reveals cuff is intact, smooth, no nodularity or masses.  Rectovaginal exam confirms these findings.  Laboratory & Radiologic Studies: None new  Assessment & Plan: Lindsey Wallace is a 72 y.o. woman with Stage 1A grade 2 (Stage IA2 by 2023 staging) endometrioid endometrial adenocarcinoma s/p surgery in 08/2021. Completed VBT in 10/2021. MMRp.    Patient is doing well. NED on exam. Encouraged to continue using her vaginal dilator regularly.  Per NCCN surveillance recommendations, we will proceed with surveillance visits every 3 months for the first 2 years. She will see Dr. Roselind Messier in 3 months and return for a visit here in 6 months. We reviewed signs and symptoms that would be concerning for disease recurrence, patient knows to call if she develops any of these.  20 minutes of total time was spent for this patient encounter, including preparation, face-to-face counseling with the patient and coordination of care, and documentation of the encounter.  Eugene Garnet, MD  Division of Gynecologic Oncology  Department of Obstetrics and Gynecology  [facility]

## 2023-02-15 NOTE — Patient Instructions (Signed)
It was good to see you today.  I do not see or feel any evidence of cancer recurrence on your exam.  I will see you for follow-up in 6 months.  As always, if you develop any new and concerning symptoms before your next visit, please call to see me sooner.   

## 2023-03-25 DIAGNOSIS — R011 Cardiac murmur, unspecified: Secondary | ICD-10-CM | POA: Diagnosis not present

## 2023-03-25 DIAGNOSIS — R7303 Prediabetes: Secondary | ICD-10-CM | POA: Diagnosis not present

## 2023-03-25 DIAGNOSIS — K5140 Inflammatory polyps of colon without complications: Secondary | ICD-10-CM | POA: Diagnosis not present

## 2023-03-25 DIAGNOSIS — I10 Essential (primary) hypertension: Secondary | ICD-10-CM | POA: Diagnosis not present

## 2023-03-25 DIAGNOSIS — Z0000 Encounter for general adult medical examination without abnormal findings: Secondary | ICD-10-CM | POA: Diagnosis not present

## 2023-03-25 DIAGNOSIS — I700 Atherosclerosis of aorta: Secondary | ICD-10-CM | POA: Diagnosis not present

## 2023-03-25 DIAGNOSIS — E785 Hyperlipidemia, unspecified: Secondary | ICD-10-CM | POA: Diagnosis not present

## 2023-03-26 DIAGNOSIS — E785 Hyperlipidemia, unspecified: Secondary | ICD-10-CM | POA: Diagnosis not present

## 2023-03-26 DIAGNOSIS — I10 Essential (primary) hypertension: Secondary | ICD-10-CM | POA: Diagnosis not present

## 2023-03-26 DIAGNOSIS — R7303 Prediabetes: Secondary | ICD-10-CM | POA: Diagnosis not present

## 2023-04-03 DIAGNOSIS — R011 Cardiac murmur, unspecified: Secondary | ICD-10-CM | POA: Diagnosis not present

## 2023-04-09 ENCOUNTER — Encounter: Payer: Self-pay | Admitting: Obstetrics and Gynecology

## 2023-05-14 DIAGNOSIS — Z111 Encounter for screening for respiratory tuberculosis: Secondary | ICD-10-CM | POA: Diagnosis not present

## 2023-05-19 NOTE — Progress Notes (Signed)
Radiation Oncology         [phone] ________________________________  Name: Lindsey Wallace MRN: [id number]  Date: 05/20/2023  DOB: 15-Mar-1951  Follow-Up Visit Note  CC: Maurice Small, MD (Inactive)  Carver Fila, MD  No diagnosis found.  Diagnosis: The encounter diagnosis was Endometrial carcinoma (HCC).   Endometrial Cancer - FIGO grade 2 endometrioid carcinoma, Stage pT1a, pN0  Interval Since Last Radiation: 1 year, 6 months, and 1 day   Intent: Curative  Radiation Treatment Dates: 10/18/2021 through 11/15/2021 Site Technique Total Dose (Gy) Dose per Fx (Gy) Completed Fx Beam Energies  Vagina: Pelvis HDR-brachy 30/30 6 5/5 Ir-192    Narrative:  The patient returns today for routine follow-up. She was last seen here for follow-up on 11/22/22.      Since her last visit, the patient followed up with Dr. Pricilla Holm on 02/15/23. During which time, she denied any symptoms concerning for disease recurrence and she was noted as NED on examination.              No other significant interval history since the patient was last seen for follow-up.   ***                Allergies:  is allergic to penicillins.  Meds: Current Outpatient Medications  Medication Sig Dispense Refill   latanoprost (XALATAN) 0.005 % ophthalmic solution Place 1 drop into both eyes at bedtime.     lisinopril (PRINIVIL,ZESTRIL) 10 MG tablet Take 10 mg by mouth daily.     Multiple Vitamins-Minerals (MULTI FOR HER 50+) TABS 1 tablet     pravastatin (PRAVACHOL) 40 MG tablet Take 40 mg by mouth every morning.     No current facility-administered medications for this encounter.    Physical Findings: The patient is in no acute distress. Patient is alert and oriented.  vitals were not taken for this visit. .  No significant changes. Lungs are clear to auscultation bilaterally. Heart has regular rate and rhythm. No palpable cervical, supraclavicular, or axillary adenopathy. Abdomen soft, non-tender,  normal bowel sounds.  On pelvic examination the external genitalia were unremarkable. A speculum exam was performed. There are no mucosal lesions noted in the vaginal vault. A Pap smear was obtained of the proximal vagina. On bimanual and rectovaginal examination there were no pelvic masses appreciated. ***   Lab Findings: Lab Results  Component Value Date   WBC 6.1 08/28/2021   HGB 12.9 08/28/2021   HCT 42.8 08/28/2021   MCV 72.3 (L) 08/28/2021   PLT 214 08/28/2021    Radiographic Findings: No results found.  Impression:  The encounter diagnosis was Endometrial carcinoma (HCC).   Endometrial Cancer - FIGO grade 2 endometrioid carcinoma, Stage pT1a, pN0  The patient is recovering from the effects of radiation.  ***  Plan:  ***   *** minutes of total time was spent for this patient encounter, including preparation, face-to-face counseling with the patient and coordination of care, physical exam, and documentation of the encounter. ____________________________________  Billie Lade, PhD, MD  This document serves as a record of services personally performed by Antony Blackbird, MD. It was created on his behalf by Neena Rhymes, a trained medical scribe. The creation of this record is based on the scribe's personal observations and the provider's statements to them. This document has been checked and approved by the attending provider.

## 2023-05-20 ENCOUNTER — Telehealth: Payer: Self-pay

## 2023-05-20 ENCOUNTER — Encounter: Payer: Self-pay | Admitting: Radiation Oncology

## 2023-05-20 ENCOUNTER — Ambulatory Visit
Admission: RE | Admit: 2023-05-20 | Discharge: 2023-05-20 | Disposition: A | Discharge status: Home / Self Care | Payer: Medicare PPO | Source: Ambulatory Visit | Attending: Radiation Oncology | Admitting: Radiation Oncology

## 2023-05-20 VITALS — BP 123/61 | HR 79 | Temp 97.8°F | Resp 20 | Ht 68.9 in | Wt 267.2 lb

## 2023-05-20 DIAGNOSIS — Z79899 Other long term (current) drug therapy: Secondary | ICD-10-CM | POA: Diagnosis not present

## 2023-05-20 DIAGNOSIS — Z8542 Personal history of malignant neoplasm of other parts of uterus: Secondary | ICD-10-CM | POA: Insufficient documentation

## 2023-05-20 DIAGNOSIS — Z923 Personal history of irradiation: Secondary | ICD-10-CM | POA: Insufficient documentation

## 2023-05-20 DIAGNOSIS — C541 Malignant neoplasm of endometrium: Secondary | ICD-10-CM

## 2023-05-20 NOTE — Progress Notes (Signed)
Lindsey Wallace is here today for follow up post radiation to the pelvic.  They completed their radiation on: 11/15/2021  Does the patient complain of any of the following:  Pain:No Abdominal bloating: No Diarrhea/Constipation: No Nausea/Vomiting: No Vaginal Discharge: No Blood in Urine or Stool: No Urinary Issues (dysuria/incomplete emptying/ incontinence/ increased frequency/urgency): No Does patient report using vaginal dilator 2-3 times a week and/or sexually active 2-3 weeks: Yes, she reported using dilators once a week. Post radiation skin changes: No   BP 123/61 (BP Location: Left Wrist, Patient Position: Sitting, Cuff Size: Normal)   Pulse 79   Temp 97.8 F (36.6 C)   Resp 20   Ht 5' 8.9" (1.75 m)   Wt 267 lb 3.2 oz (121.2 kg)   SpO2 100%   BMI 39.57 kg/m

## 2023-05-20 NOTE — Telephone Encounter (Signed)
Attempted to call Ms. Theodosia Blender multiple times but no answer. Left voicemail

## 2023-06-01 DIAGNOSIS — Z23 Encounter for immunization: Secondary | ICD-10-CM | POA: Diagnosis not present

## 2023-06-14 DIAGNOSIS — Z1231 Encounter for screening mammogram for malignant neoplasm of breast: Secondary | ICD-10-CM | POA: Diagnosis not present

## 2023-07-11 DIAGNOSIS — I10 Essential (primary) hypertension: Secondary | ICD-10-CM | POA: Diagnosis not present

## 2023-07-11 DIAGNOSIS — Z6841 Body Mass Index (BMI) 40.0 and over, adult: Secondary | ICD-10-CM | POA: Diagnosis not present

## 2023-07-11 DIAGNOSIS — M25552 Pain in left hip: Secondary | ICD-10-CM | POA: Diagnosis not present

## 2023-07-14 ENCOUNTER — Ambulatory Visit
Admission: EM | Admit: 2023-07-14 | Discharge: 2023-07-14 | Disposition: A | Discharge status: Home / Self Care | Payer: Medicare PPO

## 2023-07-14 DIAGNOSIS — B029 Zoster without complications: Secondary | ICD-10-CM

## 2023-07-14 MED ORDER — VALACYCLOVIR HCL 1 G PO TABS: 1000.0000 mg | ORAL_TABLET | Freq: Three times a day (TID) | ORAL | 0 refills | Status: DC

## 2023-07-14 NOTE — ED Triage Notes (Signed)
Rash to center of chest, left shoulder, and extends down left arm.  Noticed rash onset 1 week ago.  No active drainage at this time. States "stinging" sensation but able to tolerate shirt being in contact with rash. No new detergents, lotions, etc. No other family members showing signs of rash.

## 2023-07-14 NOTE — ED Provider Notes (Signed)
[facility]    CSN: [id number] Arrival date & time: 07/14/23  1024      History   Chief Complaint Chief Complaint  Patient presents with   Rash    HPI Lindsey Wallace is a 72 y.o. female.   Patient here today for evaluation of rash on the side of her chest that spreads to her left shoulder and down her left arm.  Rash to her chest is in a cluster and she only has few scattered lesions to her shoulder and arm.  She denies any fever.  She reports she did have some fever with recent upper respiratory infection about a week ago but otherwise none.  She denies any shortness of breath.  She notes that rash does burn and is sensitive to touch.  The history is provided by the patient.  Rash Associated symptoms: no abdominal pain, no fever, no nausea, no shortness of breath and not vomiting     Past Medical History:  Diagnosis Date   BMI 39.0-39.9,adult    endometrial ca 07/2021   History of radiation therapy    endometrial - HDR brachy VCC 10/18/2021-11/15/2021  Dr Antony Blackbird   HLD (hyperlipidemia)    Hypertension    Pneumonia    remote history    Patient Active Problem List   Diagnosis Date Noted   Endometrial carcinoma (HCC) 08/18/2021   Obesity (BMI 30-39.9) 08/18/2021   Hyperlipidemia 08/18/2021   Insomnia 08/18/2021   Sciatica 08/18/2021   Hypertensive disorder 05/08/2021    Past Surgical History:  Procedure Laterality Date   DILATION AND CURETTAGE OF UTERUS  08/03/2021   EYE SURGERY     LYMPH NODE DISSECTION N/A 08/29/2021   Procedure: POSSIBLE LYMPH NODE DISSECTION;  Surgeon: Carver Fila, MD;  Location: [facility];  Service: Gynecology;  Laterality: N/A;   ROBOTIC ASSISTED BILATERAL SALPINGO OOPHERECTOMY Bilateral 08/29/2021   Procedure: XI ROBOTIC ASSISTED BILATERAL SALPINGO OOPHORECTOMY TOTAL HYSTERECTOMY;  Surgeon: Carver Fila, MD;  Location: [facility];  Service: Gynecology;  Laterality: Bilateral;   SENTINEL NODE BIOPSY N/A 08/29/2021    Procedure: SENTINEL NODE BIOPSY;  Surgeon: Carver Fila, MD;  Location: [facility];  Service: Gynecology;  Laterality: N/A;    OB History     Gravida  0   Para  0   Term  0   Preterm  0   AB  0   Living  0      SAB  0   IAB  0   Ectopic  0   Multiple  0   Live Births  0            Home Medications    Prior to Admission medications   Medication Sig Start Date End Date Taking? Authorizing Provider  latanoprost (XALATAN) 0.005 % ophthalmic solution Place 1 drop into both eyes at bedtime. 12/26/22  Yes [provider]  valACYclovir (VALTREX) 1000 MG tablet Take 1 tablet (1,000 mg total) by mouth 3 (three) times daily. 07/14/23  Yes Tomi Bamberger, PA-C  aspirin 81 MG chewable tablet Chew 81 mg by mouth daily.    [provider]  latanoprost (XALATAN) 0.005 % ophthalmic solution Place 1 drop into both eyes at bedtime. 05/22/21   [provider]  latanoprost (XALATAN) 0.005 % ophthalmic solution Place 1 drop into both eyes at bedtime.    [provider]  lisinopril (PRINIVIL,ZESTRIL) 10 MG tablet Take 10 mg by mouth daily.    [provider]  lisinopril (ZESTRIL) 10 MG tablet Take 10 mg by mouth daily.    [provider]  Multiple Vitamins-Minerals (MULTI FOR HER 50+) TABS 1 tablet    [provider]  Multiple Vitamins-Minerals (MULTI FOR HER 50+) TABS Take 1 tablet by mouth daily.    [provider]  pravastatin (PRAVACHOL) 40 MG tablet Take 40 mg by mouth every morning.    [provider]  pravastatin (PRAVACHOL) 40 MG tablet Take 40 mg by mouth daily.    [provider]    Family History Family History  Problem Relation Age of Onset   Uterine cancer Maternal Grandmother    Uterine cancer Paternal Grandmother    Leukemia Maternal Aunt    Colon cancer Neg Hx    Breast cancer Neg Hx    Ovarian cancer Neg Hx    Endometrial cancer Neg Hx    Pancreatic cancer Neg Hx     Prostate cancer Neg Hx     Social History Social History   Tobacco Use   Smoking status: Never  Vaping Use   Vaping status: Never Used  Substance Use Topics   Alcohol use: Yes    Comment: glass of wine here and there   Drug use: No     Allergies   Penicillin g sodium and Penicillins   Review of Systems Review of Systems  Constitutional:  Negative for chills and fever.  Eyes:  Negative for discharge and redness.  Respiratory:  Negative for shortness of breath.   Gastrointestinal:  Negative for abdominal pain, nausea and vomiting.  Skin:  Positive for rash.     Physical Exam Triage Vital Signs ED Triage Vitals  Encounter Vitals Group     BP 07/14/23 1137 (!) 177/78     Systolic BP Percentile --      Diastolic BP Percentile --      Pulse Rate 07/14/23 1137 84     Resp 07/14/23 1137 20     Temp 07/14/23 1137 97.7 F (36.5 C)     Temp Source 07/14/23 1137 Oral     SpO2 07/14/23 1137 97 %     Weight --      Height --      Head Circumference --      Peak Flow --      Pain Score 07/14/23 1139 4     Pain Loc --      Pain Education --      Exclude from Growth Chart --    No data found.  Updated Vital Signs BP (!) 140/74 (BP Location: Left Arm)   Pulse 74   Temp 97.7 F (36.5 C) (Oral)   Resp 20   SpO2 97%   Visual Acuity Right Eye Distance:   Left Eye Distance:   Bilateral Distance:    Right Eye Near:   Left Eye Near:    Bilateral Near:     Physical Exam Vitals and nursing note reviewed.  Constitutional:      General: She is not in acute distress.    Appearance: Normal appearance. She is not ill-appearing.  HENT:     Head: Normocephalic and atraumatic.  Eyes:     Conjunctiva/sclera: Conjunctivae normal.  Cardiovascular:     Rate and Rhythm: Normal rate.  Pulmonary:     Effort: Pulmonary effort is normal. No respiratory distress.  Skin:    Comments: Mildly erythematous vesicular cluster to chest area with rare similar-appearing lesions to  left shoulder  and left upper arm  Neurological:     Mental Status: She is alert.  Psychiatric:        Mood and Affect: Mood normal.        Behavior: Behavior normal.        Thought Content: Thought content normal.      UC Treatments / Results  Labs (all labs ordered are listed, but only abnormal results are displayed) Labs Reviewed - No data to display  EKG   Radiology No results found.  Procedures Procedures (including critical care time)  Medications Ordered in UC Medications - No data to display  Initial Impression / Assessment and Plan / UC Course  I have reviewed the triage vital signs and the nursing notes.  Pertinent labs & imaging results that were available during my care of the patient were reviewed by me and considered in my medical decision making (see chart for details).     Discussed that rash seems consistent with shingles.  Will treat with Valtrex and recommended follow-up if no gradual improvement with any further concerns.   Final Clinical Impressions(s) / UC Diagnoses   Final diagnoses:  Herpes zoster without complication   Discharge Instructions   None    ED Prescriptions     Medication Sig Dispense Auth. Provider   valACYclovir (VALTREX) 1000 MG tablet Take 1 tablet (1,000 mg total) by mouth 3 (three) times daily. 21 tablet Tomi Bamberger, PA-C      PDMP not reviewed this encounter.   Tomi Bamberger, PA-C 07/14/23 1535

## 2023-08-09 ENCOUNTER — Ambulatory Visit: Payer: Medicare PPO | Admitting: Gynecologic Oncology

## 2023-08-09 ENCOUNTER — Inpatient Hospital Stay: Payer: Medicare PPO | Attending: Gynecologic Oncology | Admitting: Gynecologic Oncology

## 2023-08-09 ENCOUNTER — Encounter: Payer: Self-pay | Admitting: Gynecologic Oncology

## 2023-08-09 VITALS — BP 142/86 | HR 78 | Temp 98.5°F | Resp 20 | Wt 270.4 lb

## 2023-08-09 DIAGNOSIS — Z923 Personal history of irradiation: Secondary | ICD-10-CM | POA: Diagnosis not present

## 2023-08-09 DIAGNOSIS — C541 Malignant neoplasm of endometrium: Secondary | ICD-10-CM

## 2023-08-09 DIAGNOSIS — Z90722 Acquired absence of ovaries, bilateral: Secondary | ICD-10-CM | POA: Diagnosis not present

## 2023-08-09 DIAGNOSIS — Z9079 Acquired absence of other genital organ(s): Secondary | ICD-10-CM | POA: Insufficient documentation

## 2023-08-09 DIAGNOSIS — Z8542 Personal history of malignant neoplasm of other parts of uterus: Secondary | ICD-10-CM

## 2023-08-09 DIAGNOSIS — Z90710 Acquired absence of both cervix and uterus: Secondary | ICD-10-CM | POA: Diagnosis not present

## 2023-08-09 DIAGNOSIS — Z08 Encounter for follow-up examination after completed treatment for malignant neoplasm: Secondary | ICD-10-CM | POA: Diagnosis not present

## 2023-08-09 NOTE — Progress Notes (Signed)
Gynecologic Oncology Return Clinic Visit  08/09/23  Reason for Visit: surveillance visit in the setting of uterine cancer   Treatment History: Oncology History Overview Note  MMR IHC normal MSS   Endometrial carcinoma (HCC)  08/18/2021 Initial Diagnosis   Endometrial carcinoma (HCC)   08/29/2021 Surgery   TRH/BSO, bilateral SLN biopsy  Findings: On EUA, 8 cm mobile uterus.  On intra-abdominal entry, normal upper abdominal survey including liver edge, diaphragm, and stomach.  Evidence of prior gastric bypass.  Clips also noted within the pelvis from bypass surgery.  No ascites.  Normal-appearing small and large bowel.  Normal-appearing fallopian tubes.  Bilateral ovaries with filmy adhesions to the medial leaf of the broad ligament and posterior uterus.  Some findings of what appeared to be endosalpingosis along the posterior uterus.  Significant adhesive disease with almost an unidentifiable plane between the bladder and the cervix with a very long cervix.  No frank tumor involvement of the cervix.  No definite evidence of bladder invasion on cystoscopy after.  Mapping successful to bilateral pelvic basins although sentinel lymph node somewhat difficult to discern on either side.  Lymph node removed where end of green lymphatic chain noted.  This was a right external iliac node and a left obturator node.  The left sentinel lymph node was removed with several surrounding lymph nodes due to its location.  On cystoscopy, dome intact, no suture noted in the bladder, strong bilateral ureteral reflux.  Rectal exam normal.   08/29/2021 Pathology Results   Stage IA grade 2 endometrioid No LVI Neg SLN   10/18/2021 - 11/15/2021 Radiation Therapy   10/18/2021 through 11/15/2021 Site Technique Total Dose (Gy) Dose per Fx (Gy) Completed Fx Beam Energies  Vagina: Pelvis HDR-brachy 30/30 6 5/5 Ir-192        Interval History: Doing well.  Denies any vaginal bleeding or discharge.  Reports baseline bowel  bladder function.  Denies abdominal pain.  Past Medical/Surgical History: Past Medical History:  Diagnosis Date   BMI 39.0-39.9,adult    endometrial ca 07/2021   History of radiation therapy    endometrial - HDR brachy VCC 10/18/2021-11/15/2021  Dr Antony Blackbird   HLD (hyperlipidemia)    Hypertension    Pneumonia    remote history    Past Surgical History:  Procedure Laterality Date   DILATION AND CURETTAGE OF UTERUS  08/03/2021   EYE SURGERY     LYMPH NODE DISSECTION N/A 08/29/2021   Procedure: POSSIBLE LYMPH NODE DISSECTION;  Surgeon: Carver Fila, MD;  Location: [facility];  Service: Gynecology;  Laterality: N/A;   ROBOTIC ASSISTED BILATERAL SALPINGO OOPHERECTOMY Bilateral 08/29/2021   Procedure: XI ROBOTIC ASSISTED BILATERAL SALPINGO OOPHORECTOMY TOTAL HYSTERECTOMY;  Surgeon: Carver Fila, MD;  Location: [facility];  Service: Gynecology;  Laterality: Bilateral;   SENTINEL NODE BIOPSY N/A 08/29/2021   Procedure: SENTINEL NODE BIOPSY;  Surgeon: Carver Fila, MD;  Location: [facility];  Service: Gynecology;  Laterality: N/A;    Family History  Problem Relation Age of Onset   Uterine cancer Maternal Grandmother    Uterine cancer Paternal Grandmother    Leukemia Maternal Aunt    Colon cancer Neg Hx    Breast cancer Neg Hx    Ovarian cancer Neg Hx    Endometrial cancer Neg Hx    Pancreatic cancer Neg Hx    Prostate cancer Neg Hx     Social History   Socioeconomic History   Marital status: Married    Spouse name: Not  on file   Number of children: Not on file   Years of education: Not on file   Highest education level: Not on file  Occupational History   Not on file  Tobacco Use   Smoking status: Never   Smokeless tobacco: Not on file  Vaping Use   Vaping status: Never Used  Substance and Sexual Activity   Alcohol use: Yes    Comment: glass of wine here and there   Drug use: No   Sexual activity: Yes    Birth control/protection: Post-menopausal  Other Topics  Concern   Not on file  Social History Narrative   Not on file   Social Drivers of Health   Financial Resource Strain: Not on file  Food Insecurity: Not on file  Transportation Needs: Not on file  Physical Activity: Not on file  Stress: Not on file  Social Connections: Not on file    Current Medications:  Current Outpatient Medications:    aspirin 81 MG chewable tablet, Chew 81 mg by mouth daily., Disp: , Rfl:    latanoprost (XALATAN) 0.005 % ophthalmic solution, Place 1 drop into both eyes at bedtime., Disp: , Rfl:    lisinopril (ZESTRIL) 10 MG tablet, Take 10 mg by mouth daily., Disp: , Rfl:    Multiple Vitamins-Minerals (MULTI FOR HER 50+) TABS, 1 tablet, Disp: , Rfl:    pravastatin (PRAVACHOL) 40 MG tablet, Take 40 mg by mouth every morning., Disp: , Rfl:   Review of Systems: Denies appetite changes, fevers, chills, fatigue, unexplained weight changes. Denies hearing loss, neck lumps or masses, mouth sores, ringing in ears or voice changes. Denies cough or wheezing.  Denies shortness of breath. Denies chest pain or palpitations. Denies leg swelling. Denies abdominal distention, pain, blood in stools, constipation, diarrhea, nausea, vomiting, or early satiety. Denies pain with intercourse, dysuria, frequency, hematuria or incontinence. Denies hot flashes, pelvic pain, vaginal bleeding or vaginal discharge.   Denies joint pain, back pain or muscle pain/cramps. Denies itching, rash, or wounds. Denies dizziness, headaches, numbness or seizures. Denies swollen lymph nodes or glands, denies easy bruising or bleeding. Denies anxiety, depression, confusion, or decreased concentration.  Physical Exam: BP (!) 150/57 (BP Location: Left Arm, Patient Position: Sitting) Comment: Notified RN  Pulse 78   Temp 98.5 F (36.9 C) (Oral)   Resp 20   Wt 270 lb 6.4 oz (122.7 kg)   SpO2 100%   BMI 40.05 kg/m  General: Alert, oriented, no acute distress. HEENT: Normocephalic, atraumatic,  sclera anicteric. Chest: Clear to auscultation bilaterally.  No wheezes or rhonchi. Cardiovascular: Regular rate and rhythm, no murmurs. Abdomen: Obese, soft, nontender.  Normoactive bowel sounds.  No masses or hepatosplenomegaly appreciated.  Well-healed incisions. Extremities: Grossly normal range of motion.  Warm, well perfused.  No edema bilaterally. Skin: No rashes or lesions noted. Lymphatics: No cervical, supraclavicular, or inguinal adenopathy. GU: Normal appearing external genitalia without erythema, excoriation, or lesions.  Speculum exam reveals mildly atrophic vaginal mucosa with radiation changes present, no lesions noted.  Bimanual exam reveals cuff is intact, smooth, no nodularity or masses.  Rectovaginal exam confirms these findings.  Laboratory & Radiologic Studies: None new  Assessment & Plan: Lindsey Wallace is a 72 y.o. woman with Stage IA grade 2 (Stage IA2 by 2023 staging) endometrioid endometrial adenocarcinoma s/p surgery in 08/2021. Completed VBT in 10/2021. MMRp.    Patient is doing well. NED on exam. Encouraged to continue using her vaginal dilator regularly.    Per [facility]CCN surveillance  recommendations, we will proceed with surveillance visits every 3 months for the first 2 years. She will see Dr. Roselind Messier in 3 months and return for a visit here in 6 months. After her visit with me in mid-2025, if she remains NED, we will transition to follow-up every 6 months. We reviewed signs and symptoms that would be concerning for disease recurrence, patient knows to call if she develops any of these.  20 minutes of total time was spent for this patient encounter, including preparation, face-to-face counseling with the patient and coordination of care, and documentation of the encounter.  Eugene Garnet, MD  Division of Gynecologic Oncology  Department of Obstetrics and Gynecology  [facility]

## 2023-08-09 NOTE — Patient Instructions (Signed)
It was good to see you today.  I do not see or feel any evidence of cancer recurrence on your exam.  I will see you for follow-up in 6 months.  As always, if you develop any new and concerning symptoms before your next visit, please call to see me sooner.   

## 2023-09-25 DIAGNOSIS — R011 Cardiac murmur, unspecified: Secondary | ICD-10-CM | POA: Diagnosis not present

## 2023-09-25 DIAGNOSIS — E785 Hyperlipidemia, unspecified: Secondary | ICD-10-CM | POA: Diagnosis not present

## 2023-09-25 DIAGNOSIS — R7303 Prediabetes: Secondary | ICD-10-CM | POA: Diagnosis not present

## 2023-09-25 DIAGNOSIS — C541 Malignant neoplasm of endometrium: Secondary | ICD-10-CM | POA: Diagnosis not present

## 2023-09-25 DIAGNOSIS — Z6839 Body mass index (BMI) 39.0-39.9, adult: Secondary | ICD-10-CM | POA: Diagnosis not present

## 2023-09-25 DIAGNOSIS — I700 Atherosclerosis of aorta: Secondary | ICD-10-CM | POA: Diagnosis not present

## 2023-09-25 DIAGNOSIS — I10 Essential (primary) hypertension: Secondary | ICD-10-CM | POA: Diagnosis not present

## 2023-11-15 NOTE — Progress Notes (Signed)
 Lindsey Wallace is here today for follow up post radiation to the pelvic.  They completed their radiation on: 11/15/2021   Does the patient complain of any of the following:  Pain: Denies Abdominal bloating: Denies Diarrhea/Constipation: Denies Nausea/Vomiting: Denies Vaginal Discharge: Denies Blood in Urine or Stool: Denies Urinary Issues (dysuria/incomplete emptying/ incontinence/ increased frequency/urgency): Denies Does patient report using vaginal dilator 2-3 times a week and/or sexually active 2-3 weeks: Yes, sexually active  Post radiation skin changes: Denies    BP (!) (P) 159/88   Pulse 78   Temp (!) 97.1 F (36.2 C) (Temporal)   Resp 18   Ht 5\' 9"  (1.753 m)   Wt 262 lb 4 oz (119 kg)   SpO2 100%   BMI 38.73 kg/m

## 2023-11-16 NOTE — Progress Notes (Signed)
 Radiation Oncology         [phone] ________________________________  Name: Lindsey Wallace MRN: [id number]  Date: 11/18/2023  DOB: [birth year]  Follow-Up Visit Note  CC: Ollen Bowl, MD  Carver Fila, MD  No diagnosis found.  Diagnosis: The encounter diagnosis was Endometrial carcinoma (HCC).   Endometrial Cancer - FIGO grade 2 endometrioid carcinoma, Stage pT1a, pN0  Interval Since Last Radiation: 2 years and 3 days   Intent: Curative  Radiation Treatment Dates: 10/18/2021 through 11/15/2021 Site Technique Total Dose (Gy) Dose per Fx (Gy) Completed Fx Beam Energies  Vagina: Pelvis HDR-brachy 30/30 6 5/5 Ir-192    Narrative:  The patient returns today for routine follow-up. She was last seen here for follow-up on 05/20/23.     Since her last visit, she most recently followed up with Dr. Pricilla Holm on 08/09/23 and was noted to be doing well and NED on examination at that time.   No other significant oncologic interval history since she was last seen for follow-up.   Of note: She was seen at an urgent care facility for a rash on 07/14/23 which was determined to be shingles - prescribed valtrex for management.     ***                         Allergies:  is allergic to penicillin g sodium and penicillins.  Meds: Current Outpatient Medications  Medication Sig Dispense Refill   aspirin 81 MG chewable tablet Chew 81 mg by mouth daily.     latanoprost (XALATAN) 0.005 % ophthalmic solution Place 1 drop into both eyes at bedtime.     lisinopril (ZESTRIL) 10 MG tablet Take 10 mg by mouth daily.     Multiple Vitamins-Minerals (MULTI FOR HER 50+) TABS 1 tablet     pravastatin (PRAVACHOL) 40 MG tablet Take 40 mg by mouth every morning.     No current facility-administered medications for this encounter.    Physical Findings: The patient is in no acute distress. Patient is alert and oriented.  vitals were not taken for this visit. .  No significant changes. Lungs  are clear to auscultation bilaterally. Heart has regular rate and rhythm. No palpable cervical, supraclavicular, or axillary adenopathy. Abdomen soft, non-tender, normal bowel sounds.  On pelvic examination the external genitalia were unremarkable. A speculum exam was performed. There are no mucosal lesions noted in the vaginal vault. A Pap smear was obtained of the proximal vagina. On bimanual and rectovaginal examination there were no pelvic masses appreciated. ***    Lab Findings: Lab Results  Component Value Date   WBC 6.1 08/28/2021   HGB 12.9 08/28/2021   HCT 42.8 08/28/2021   MCV 72.3 (L) 08/28/2021   PLT 214 08/28/2021    Radiographic Findings: No results found.  Impression:  The encounter diagnosis was Endometrial carcinoma (HCC).   Endometrial Cancer - FIGO grade 2 endometrioid carcinoma, Stage pT1a, pN0  The patient is recovering from the effects of radiation.  ***  Plan:  ***   *** minutes of total time was spent for this patient encounter, including preparation, face-to-face counseling with the patient and coordination of care, physical exam, and documentation of the encounter. ____________________________________  Billie Lade, PhD, MD  This document serves as a record of services personally performed by Antony Blackbird, MD. It was created on his behalf by Neena Rhymes, a trained medical scribe. The creation of this record is based on the scribe's  personal observations and the provider's statements to them. This document has been checked and approved by the attending provider.

## 2023-11-18 ENCOUNTER — Ambulatory Visit
Admission: RE | Admit: 2023-11-18 | Discharge: 2023-11-18 | Disposition: A | Discharge status: Home / Self Care | Payer: Self-pay | Source: Ambulatory Visit | Attending: Radiation Oncology | Admitting: Radiation Oncology

## 2023-11-18 ENCOUNTER — Encounter: Payer: Self-pay | Admitting: Radiation Oncology

## 2023-11-18 VITALS — BP 159/88 | HR 78 | Temp 97.1°F | Resp 18 | Ht 69.0 in | Wt 262.2 lb

## 2023-11-18 DIAGNOSIS — Z7982 Long term (current) use of aspirin: Secondary | ICD-10-CM | POA: Diagnosis not present

## 2023-11-18 DIAGNOSIS — Z8542 Personal history of malignant neoplasm of other parts of uterus: Secondary | ICD-10-CM | POA: Diagnosis not present

## 2023-11-18 DIAGNOSIS — C541 Malignant neoplasm of endometrium: Secondary | ICD-10-CM | POA: Diagnosis not present

## 2023-11-18 DIAGNOSIS — Z79899 Other long term (current) drug therapy: Secondary | ICD-10-CM | POA: Diagnosis not present

## 2023-11-18 DIAGNOSIS — Z923 Personal history of irradiation: Secondary | ICD-10-CM | POA: Diagnosis not present

## 2023-11-18 NOTE — Addendum Note (Signed)
 Encounter addended by: Rana Snare, LPN on: 2/84/1324 1:49 PM  Actions taken: Charge Capture section accepted

## 2023-12-18 DIAGNOSIS — R059 Cough, unspecified: Secondary | ICD-10-CM | POA: Diagnosis not present

## 2023-12-19 ENCOUNTER — Other Ambulatory Visit: Payer: Self-pay | Admitting: Family Medicine

## 2023-12-19 ENCOUNTER — Ambulatory Visit
Admission: RE | Admit: 2023-12-19 | Discharge: 2023-12-19 | Disposition: A | Discharge status: Home / Self Care | Source: Ambulatory Visit | Attending: Family Medicine | Admitting: Family Medicine

## 2023-12-19 DIAGNOSIS — R059 Cough, unspecified: Secondary | ICD-10-CM | POA: Diagnosis not present

## 2024-02-04 ENCOUNTER — Encounter: Payer: Self-pay | Admitting: Gynecologic Oncology

## 2024-02-07 ENCOUNTER — Encounter: Payer: Self-pay | Admitting: Gynecologic Oncology

## 2024-02-07 ENCOUNTER — Inpatient Hospital Stay: Payer: Medicare PPO | Attending: Gynecologic Oncology | Admitting: Gynecologic Oncology

## 2024-02-07 VITALS — BP 129/53 | HR 75 | Temp 97.8°F | Resp 16 | Ht 69.0 in | Wt 260.8 lb

## 2024-02-07 DIAGNOSIS — Z08 Encounter for follow-up examination after completed treatment for malignant neoplasm: Secondary | ICD-10-CM | POA: Diagnosis not present

## 2024-02-07 DIAGNOSIS — Z90710 Acquired absence of both cervix and uterus: Secondary | ICD-10-CM | POA: Diagnosis not present

## 2024-02-07 DIAGNOSIS — Z8542 Personal history of malignant neoplasm of other parts of uterus: Secondary | ICD-10-CM | POA: Diagnosis not present

## 2024-02-07 DIAGNOSIS — Z90722 Acquired absence of ovaries, bilateral: Secondary | ICD-10-CM | POA: Insufficient documentation

## 2024-02-07 DIAGNOSIS — Z9079 Acquired absence of other genital organ(s): Secondary | ICD-10-CM | POA: Insufficient documentation

## 2024-02-07 DIAGNOSIS — C541 Malignant neoplasm of endometrium: Secondary | ICD-10-CM

## 2024-02-07 NOTE — Patient Instructions (Signed)
 It was good to see you today.  I do not see or feel any evidence of cancer recurrence on your exam.  We will see you for follow-up in 12 months.  As always, if you develop any new and concerning symptoms before your next visit, please call to see me sooner.

## 2024-02-07 NOTE — Progress Notes (Signed)
 Gynecologic Oncology Return Clinic Visit  02/07/24  Reason for Visit: surveillance visit in the setting of uterine cancer   Treatment History: Oncology History Overview Note  MMR IHC normal MSS   Endometrial carcinoma (HCC)  08/18/2021 Initial Diagnosis   Endometrial carcinoma (HCC)   08/29/2021 Surgery   TRH/BSO, bilateral SLN biopsy  Findings: On EUA, 8 cm mobile uterus.  On intra-abdominal entry, normal upper abdominal survey including liver edge, diaphragm, and stomach.  Evidence of prior gastric bypass.  Clips also noted within the pelvis from bypass surgery.  No ascites.  Normal-appearing small and large bowel.  Normal-appearing fallopian tubes.  Bilateral ovaries with filmy adhesions to the medial leaf of the broad ligament and posterior uterus.  Some findings of what appeared to be endosalpingosis along the posterior uterus.  Significant adhesive disease with almost an unidentifiable plane between the bladder and the cervix with a very long cervix.  No frank tumor involvement of the cervix.  No definite evidence of bladder invasion on cystoscopy after.  Mapping successful to bilateral pelvic basins although sentinel lymph node somewhat difficult to discern on either side.  Lymph node removed where end of green lymphatic chain noted.  This was a right external iliac node and a left obturator node.  The left sentinel lymph node was removed with several surrounding lymph nodes due to its location.  On cystoscopy, dome intact, no suture noted in the bladder, strong bilateral ureteral reflux.  Rectal exam normal.   08/29/2021 Pathology Results   Stage IA grade 2 endometrioid No LVI Neg SLN   10/18/2021 - 11/15/2021 Radiation Therapy   10/18/2021 through 11/15/2021 Site Technique Total Dose (Gy) Dose per Fx (Gy) Completed Fx Beam Energies  Vagina: Pelvis HDR-brachy 30/30 6 5/5 Ir-192        Interval History: Doing well.  Denies any vaginal bleeding.  Continues to use her dilator about  once a week.  Reports baseline bowel bladder function.  Denies any abdominal pain.  Past Medical/Surgical History: Past Medical History:  Diagnosis Date   BMI 39.0-39.9,adult    endometrial ca 07/2021   History of radiation therapy    endometrial - HDR brachy [facility] 10/18/2021-11/15/2021  Dr Retta Caster   HLD (hyperlipidemia)    Hypertension    Pneumonia    remote history    Past Surgical History:  Procedure Laterality Date   DILATION AND CURETTAGE OF UTERUS  08/03/2021   EYE SURGERY     LYMPH NODE DISSECTION N/A 08/29/2021   Procedure: POSSIBLE LYMPH NODE DISSECTION;  Surgeon: Suzi Essex, MD;  Location: [facility];  Service: Gynecology;  Laterality: N/A;   ROBOTIC ASSISTED BILATERAL SALPINGO OOPHERECTOMY Bilateral 08/29/2021   Procedure: XI ROBOTIC ASSISTED BILATERAL SALPINGO OOPHORECTOMY TOTAL HYSTERECTOMY;  Surgeon: Suzi Essex, MD;  Location: [facility];  Service: Gynecology;  Laterality: Bilateral;   SENTINEL NODE BIOPSY N/A 08/29/2021   Procedure: SENTINEL NODE BIOPSY;  Surgeon: Suzi Essex, MD;  Location: [facility];  Service: Gynecology;  Laterality: N/A;    Family History  Problem Relation Age of Onset   Uterine cancer Maternal Grandmother    Uterine cancer Paternal Grandmother    Leukemia Maternal Aunt    Colon cancer Neg Hx    Breast cancer Neg Hx    Ovarian cancer Neg Hx    Endometrial cancer Neg Hx    Pancreatic cancer Neg Hx    Prostate cancer Neg Hx     Social History   Socioeconomic History  Marital status: Married    Spouse name: Not on file   Number of children: Not on file   Years of education: Not on file   Highest education level: Not on file  Occupational History   Not on file  Tobacco Use   Smoking status: Never   Smokeless tobacco: Not on file  Vaping Use   Vaping status: Never Used  Substance and Sexual Activity   Alcohol use: Yes    Comment: glass of wine here and there   Drug use: No   Sexual activity: Yes    Birth  control/protection: Post-menopausal  Other Topics Concern   Not on file  Social History Narrative   Not on file   Social Drivers of Health   Financial Resource Strain: Not on file  Food Insecurity: Not on file  Transportation Needs: Not on file  Physical Activity: Not on file  Stress: Not on file  Social Connections: Not on file    Current Medications:  Current Outpatient Medications:    aspirin 81 MG chewable tablet, Chew 81 mg by mouth daily., Disp: , Rfl:    latanoprost (XALATAN) 0.005 % ophthalmic solution, Place 1 drop into both eyes at bedtime., Disp: , Rfl:    lisinopril (ZESTRIL) 10 MG tablet, Take 10 mg by mouth daily., Disp: , Rfl:    Multiple Vitamins-Minerals (MULTI FOR HER 50+) TABS, 1 tablet, Disp: , Rfl:    pravastatin (PRAVACHOL) 40 MG tablet, Take 40 mg by mouth every morning., Disp: , Rfl:   Review of Systems: Denies appetite changes, fevers, chills, fatigue, unexplained weight changes. Denies hearing loss, neck lumps or masses, mouth sores, ringing in ears or voice changes. Denies cough or wheezing.  Denies shortness of breath. Denies chest pain or palpitations. Denies leg swelling. Denies abdominal distention, pain, blood in stools, constipation, diarrhea, nausea, vomiting, or early satiety. Denies pain with intercourse, dysuria, frequency, hematuria or incontinence. Denies hot flashes, pelvic pain, vaginal bleeding or vaginal discharge.   Denies joint pain, back pain or muscle pain/cramps. Denies itching, rash, or wounds. Denies dizziness, headaches, numbness or seizures. Denies swollen lymph nodes or glands, denies easy bruising or bleeding. Denies anxiety, depression, confusion, or decreased concentration.  Physical Exam: BP (!) 129/53 (BP Location: Left Arm, Patient Position: Sitting)   Pulse 75   Temp 97.8 F (36.6 C) (Tympanic)   Resp 16   Ht 5' 9 (1.753 m)   Wt 260 lb 12.8 oz (118.3 kg)   SpO2 100%   BMI 38.51 kg/m  General: Alert,  oriented, no acute distress. HEENT: Normocephalic, atraumatic, sclera anicteric. Chest: Clear to auscultation bilaterally.  No wheezes or rhonchi. Cardiovascular: Regular rate and rhythm, no murmurs. Abdomen: Obese, soft, nontender.  Normoactive bowel sounds.  No masses or hepatosplenomegaly appreciated.  Well-healed incisions. Extremities: Grossly normal range of motion.  Warm, well perfused.  No edema bilaterally. Skin: No rashes or lesions noted. Lymphatics: No cervical, supraclavicular, or inguinal adenopathy. GU: Normal appearing external genitalia without erythema, excoriation, or lesions.  Speculum exam reveals mildly atrophic vaginal mucosa with radiation changes present, no lesions noted.  Bimanual exam reveals cuff is intact, smooth, no nodularity or masses.  Rectovaginal exam confirms these findings.  Laboratory & Radiologic Studies: None new  Assessment & Plan: Lindsey Wallace is a 73 y.o. woman with Stage IA grade 2 (Stage IA2 by 2023 staging) endometrioid endometrial adenocarcinoma s/p surgery in 08/2021. Completed VBT in 10/2021. MMRp.    Patient is doing well. NED on exam  today. Encouraged to continue using her vaginal dilator regularly.    Per NCCN surveillance recommendations, we will transition to visit every 6 months. She will see Dr. Eloise Hake in 6 months and return for a visit here in 12 months. We reviewed signs and symptoms that would be concerning for disease recurrence, patient knows to call if she develops any of these.  20 minutes of total time was spent for this patient encounter, including preparation, face-to-face counseling with the patient and coordination of care, and documentation of the encounter.  Wiley Hanger, MD  Division of Gynecologic Oncology  Department of Obstetrics and Gynecology  [facility] [place]  [facility]

## 2024-02-17 DIAGNOSIS — H2513 Age-related nuclear cataract, bilateral: Secondary | ICD-10-CM | POA: Diagnosis not present

## 2024-03-10 ENCOUNTER — Ambulatory Visit: Admitting: Gynecologic Oncology

## 2024-03-24 DIAGNOSIS — C541 Malignant neoplasm of endometrium: Secondary | ICD-10-CM | POA: Diagnosis not present

## 2024-03-24 DIAGNOSIS — I700 Atherosclerosis of aorta: Secondary | ICD-10-CM | POA: Diagnosis not present

## 2024-03-24 DIAGNOSIS — R011 Cardiac murmur, unspecified: Secondary | ICD-10-CM | POA: Diagnosis not present

## 2024-03-24 DIAGNOSIS — I10 Essential (primary) hypertension: Secondary | ICD-10-CM | POA: Diagnosis not present

## 2024-03-24 DIAGNOSIS — Z0000 Encounter for general adult medical examination without abnormal findings: Secondary | ICD-10-CM | POA: Diagnosis not present

## 2024-03-24 DIAGNOSIS — R7303 Prediabetes: Secondary | ICD-10-CM | POA: Diagnosis not present

## 2024-03-24 DIAGNOSIS — E785 Hyperlipidemia, unspecified: Secondary | ICD-10-CM | POA: Diagnosis not present

## 2024-03-24 DIAGNOSIS — Z6839 Body mass index (BMI) 39.0-39.9, adult: Secondary | ICD-10-CM | POA: Diagnosis not present

## 2024-04-22 DIAGNOSIS — D369 Benign neoplasm, unspecified site: Secondary | ICD-10-CM | POA: Diagnosis not present

## 2024-05-29 DIAGNOSIS — Z09 Encounter for follow-up examination after completed treatment for conditions other than malignant neoplasm: Secondary | ICD-10-CM | POA: Diagnosis not present

## 2024-05-29 DIAGNOSIS — D122 Benign neoplasm of ascending colon: Secondary | ICD-10-CM | POA: Diagnosis not present

## 2024-05-29 DIAGNOSIS — Z860101 Personal history of adenomatous and serrated colon polyps: Secondary | ICD-10-CM | POA: Diagnosis not present

## 2024-05-29 DIAGNOSIS — D123 Benign neoplasm of transverse colon: Secondary | ICD-10-CM | POA: Diagnosis not present

## 2024-05-29 DIAGNOSIS — D124 Benign neoplasm of descending colon: Secondary | ICD-10-CM | POA: Diagnosis not present

## 2024-05-29 DIAGNOSIS — D125 Benign neoplasm of sigmoid colon: Secondary | ICD-10-CM | POA: Diagnosis not present

## 2024-06-02 DIAGNOSIS — D124 Benign neoplasm of descending colon: Secondary | ICD-10-CM | POA: Diagnosis not present

## 2024-06-02 DIAGNOSIS — D123 Benign neoplasm of transverse colon: Secondary | ICD-10-CM | POA: Diagnosis not present

## 2024-06-02 DIAGNOSIS — D125 Benign neoplasm of sigmoid colon: Secondary | ICD-10-CM | POA: Diagnosis not present

## 2024-06-02 DIAGNOSIS — D122 Benign neoplasm of ascending colon: Secondary | ICD-10-CM | POA: Diagnosis not present

## 2024-06-19 DIAGNOSIS — Z1231 Encounter for screening mammogram for malignant neoplasm of breast: Secondary | ICD-10-CM | POA: Diagnosis not present

## 2024-06-19 DIAGNOSIS — E2839 Other primary ovarian failure: Secondary | ICD-10-CM | POA: Diagnosis not present

## 2024-08-16 NOTE — Progress Notes (Signed)
"  °  Radiation Oncology         [phone] ________________________________  Name: Lindsey Wallace MRN: [id number]  Date: 08/17/2024  DOB: [birth year]  Follow-Up Visit Note  CC: Vernon Velna SAUNDERS, MD  Vernon Velna SAUNDERS, MD  No diagnosis found.  Diagnosis: Endometrial Cancer - FIGO grade 2 endometrioid carcinoma, Stage pT1a, pN0; s/p vaginal brachytherapy completed on 11/15/2021   Interval Since Last Radiation: 2 years and 9 months   Intent: Curative  Radiation Treatment Dates: 10/18/2021 through 11/15/2021 Site Technique Total Dose (Gy) Dose per Fx (Gy) Completed Fx Beam Energies  Vagina: Pelvis HDR-brachy 30/30 6 5/5 Ir-192    Narrative:  The patient returns today for routine follow-up. She was last seen here for follow-up on 11/18/23.     Since her last visit, she followed up with Dr. Viktoria on 02/07/24. She was noted to be doing quite well at that time and NED examination.  No other significant interval history since the patient was last seen here for follow-up.   ***                            Allergies:  is allergic to penicillin g sodium and penicillins.  Meds: Current Outpatient Medications  Medication Sig Dispense Refill   aspirin 81 MG chewable tablet Chew 81 mg by mouth daily.     latanoprost (XALATAN) 0.005 % ophthalmic solution Place 1 drop into both eyes at bedtime.     lisinopril (ZESTRIL) 10 MG tablet Take 10 mg by mouth daily.     Multiple Vitamins-Minerals (MULTI FOR HER 50+) TABS 1 tablet     pravastatin (PRAVACHOL) 40 MG tablet Take 40 mg by mouth every morning.     No current facility-administered medications for this encounter.    Physical Findings: The patient is in no acute distress. Patient is alert and oriented.  vitals were not taken for this visit. .  No significant changes. Lungs are clear to auscultation bilaterally. Heart has regular rate and rhythm. No palpable cervical, supraclavicular, or axillary adenopathy. Abdomen soft, non-tender,  normal bowel sounds.  On pelvic examination the external genitalia were unremarkable. A speculum exam was performed. There are no mucosal lesions noted in the vaginal vault. A Pap smear was obtained of the proximal vagina. On bimanual and rectovaginal examination there were no pelvic masses appreciated. ***   Lab Findings: Lab Results  Component Value Date   WBC 6.1 08/28/2021   HGB 12.9 08/28/2021   HCT 42.8 08/28/2021   MCV 72.3 (L) 08/28/2021   PLT 214 08/28/2021    Radiographic Findings: No results found.  Impression: Endometrial Cancer - FIGO grade 2 endometrioid carcinoma, Stage pT1a, pN0; s/p vaginal brachytherapy completed on 11/15/2021   The patient is recovering from the effects of radiation.  ***  Plan:  ***   *** minutes of total time was spent for this patient encounter, including preparation, face-to-face counseling with the patient and coordination of care, physical exam, and documentation of the encounter. ____________________________________  Lynwood CHARM Nasuti, PhD, MD  This document serves as a record of services personally performed by Lynwood Nasuti, MD. It was created on his behalf by Dorthy Fuse, a trained medical scribe. The creation of this record is based on the scribe's personal observations and the provider's statements to them. This document has been checked and approved by the attending provider.  "

## 2024-08-17 ENCOUNTER — Ambulatory Visit
Admission: RE | Admit: 2024-08-17 | Discharge: 2024-08-17 | Disposition: A | Discharge status: Home / Self Care | Source: Ambulatory Visit | Attending: Radiation Oncology | Admitting: Radiation Oncology

## 2024-08-17 ENCOUNTER — Encounter: Payer: Self-pay | Admitting: Radiation Oncology

## 2024-08-17 VITALS — BP 173/76 | HR 82 | Temp 97.5°F | Resp 18 | Ht 69.0 in | Wt 264.0 lb

## 2024-08-17 DIAGNOSIS — Z8542 Personal history of malignant neoplasm of other parts of uterus: Secondary | ICD-10-CM | POA: Insufficient documentation

## 2024-08-17 DIAGNOSIS — C541 Malignant neoplasm of endometrium: Secondary | ICD-10-CM

## 2024-08-17 DIAGNOSIS — Z923 Personal history of irradiation: Secondary | ICD-10-CM | POA: Diagnosis not present

## 2024-08-17 DIAGNOSIS — Z79899 Other long term (current) drug therapy: Secondary | ICD-10-CM | POA: Insufficient documentation

## 2024-08-17 DIAGNOSIS — Z7982 Long term (current) use of aspirin: Secondary | ICD-10-CM | POA: Insufficient documentation

## 2024-08-17 NOTE — Progress Notes (Signed)
 Lindsey Wallace is here today for follow up post radiation to the pelvis  They completed their radiation on: 11/15/21   Does the patient complain of any of the following:  Pain: No Abdominal bloating: No Diarrhea/Constipation: No Nausea/Vomiting: No Vaginal Discharge: No Blood in Urine or Stool: No Urinary Issues (dysuria/incomplete emptying/ incontinence/ increased frequency/urgency): No Does patient report using vaginal dilator 2-3 times a week and/or sexually active 2-3 weeks: Yes Post radiation skin changes: No    Additional comments if applicable:    BP (!) 173/76 (BP Location: Left Arm, Patient Position: Sitting)   Pulse 82   Temp (!) 97.5 F (36.4 C) (Temporal)   Resp 18   Ht 5' 9 (1.753 m)   Wt 264 lb (119.7 kg)   SpO2 99%   BMI 38.99 kg/m

## 2025-02-16 ENCOUNTER — Ambulatory Visit: Admitting: Gynecologic Oncology

## 2025-08-19 ENCOUNTER — Ambulatory Visit: Admitting: Radiation Oncology
